# Patient Record
Sex: Female | Born: 1960 | Race: Black or African American | Hispanic: No | Marital: Married | Smoking: Never smoker
Health system: Southern US, Community
[De-identification: ages and names within clinical notes are randomized; demographics above are authoritative.]

## PROBLEM LIST (undated history)

## (undated) DIAGNOSIS — D649 Anemia, unspecified: Secondary | ICD-10-CM

## (undated) DIAGNOSIS — N83209 Unspecified ovarian cyst, unspecified side: Secondary | ICD-10-CM

## (undated) DIAGNOSIS — G4730 Sleep apnea, unspecified: Secondary | ICD-10-CM

## (undated) DIAGNOSIS — I10 Essential (primary) hypertension: Secondary | ICD-10-CM

## (undated) DIAGNOSIS — Z8619 Personal history of other infectious and parasitic diseases: Secondary | ICD-10-CM

## (undated) HISTORY — DX: Personal history of other infectious and parasitic diseases: Z86.19

## (undated) HISTORY — DX: Anemia, unspecified: D64.9

## (undated) HISTORY — DX: Unspecified ovarian cyst, unspecified side: N83.209

## (undated) HISTORY — DX: Sleep apnea, unspecified: G47.30

---

## 2004-03-26 HISTORY — PX: ABDOMINAL HYSTERECTOMY: SHX81

## 2005-01-18 ENCOUNTER — Inpatient Hospital Stay: Payer: Self-pay | Admitting: Obstetrics and Gynecology

## 2008-01-01 ENCOUNTER — Ambulatory Visit: Payer: Self-pay | Admitting: Family Medicine

## 2009-05-04 ENCOUNTER — Ambulatory Visit: Payer: Self-pay | Admitting: Family Medicine

## 2011-03-30 HISTORY — PX: OTHER SURGICAL HISTORY: SHX169

## 2012-03-29 ENCOUNTER — Emergency Department: Payer: Self-pay | Admitting: Internal Medicine

## 2012-03-29 LAB — CBC
HGB: 12.2 g/dL (ref 12.0–16.0)
MCH: 27.3 pg (ref 26.0–34.0)
MCHC: 33.1 g/dL (ref 32.0–36.0)
MCV: 83 fL (ref 80–100)
Platelet: 294 10*3/uL (ref 150–440)
RBC: 4.47 10*6/uL (ref 3.80–5.20)
RDW: 13.7 % (ref 11.5–14.5)

## 2012-03-29 LAB — COMPREHENSIVE METABOLIC PANEL
Albumin: 4.2 g/dL (ref 3.4–5.0)
Anion Gap: 9 (ref 7–16)
Calcium, Total: 9.1 mg/dL (ref 8.5–10.1)
Chloride: 109 mmol/L — ABNORMAL HIGH (ref 98–107)
Creatinine: 0.98 mg/dL (ref 0.60–1.30)
EGFR (African American): >60
EGFR (Non-African Amer.): >60
Glucose: 142 mg/dL — ABNORMAL HIGH (ref 65–99)
Osmolality: 288 (ref 275–301)
Potassium: 3.8 mmol/L (ref 3.5–5.1)

## 2012-03-29 LAB — PROTIME-INR
INR: 0.8
Prothrombin Time: 11.9 secs (ref 11.5–14.7)

## 2013-07-15 LAB — LIPID PANEL
Cholesterol: 251 mg/dL — AB (ref 0–200)
HDL: 44 mg/dL (ref 35–70)
LDL CALC: 181 mg/dL
TRIGLYCERIDES: 130 mg/dL (ref 40–160)

## 2013-07-15 LAB — CBC AND DIFFERENTIAL
HEMATOCRIT: 36 % (ref 36–46)
Hemoglobin: 11.8 g/dL — AB (ref 12.0–16.0)
PLATELETS: 287 10*3/uL (ref 150–399)
WBC: 4.9 10*3/mL

## 2013-07-15 LAB — BASIC METABOLIC PANEL
BUN: 9 mg/dL (ref 4–21)
CREATININE: 1 mg/dL (ref 0.5–1.1)
GLUCOSE: 108 mg/dL
Potassium: 4.4 mmol/L (ref 3.4–5.3)
Sodium: 142 mmol/L (ref 137–147)

## 2013-07-15 LAB — HEMOGLOBIN A1C: HEMOGLOBIN A1C: 6.1

## 2013-07-15 LAB — HEPATIC FUNCTION PANEL
ALT: 24 U/L (ref 7–35)
AST: 17 U/L (ref 13–35)

## 2013-07-15 LAB — TSH: TSH: 1.59 u[IU]/mL (ref 0.41–5.90)

## 2013-07-20 ENCOUNTER — Ambulatory Visit: Payer: Self-pay | Admitting: Family Medicine

## 2013-08-14 ENCOUNTER — Ambulatory Visit: Payer: Self-pay | Admitting: Family Medicine

## 2013-08-21 ENCOUNTER — Ambulatory Visit: Payer: Self-pay | Admitting: Family Medicine

## 2013-09-03 ENCOUNTER — Ambulatory Visit: Payer: Self-pay | Admitting: Obstetrics and Gynecology

## 2013-09-07 ENCOUNTER — Ambulatory Visit: Payer: Self-pay | Admitting: Obstetrics and Gynecology

## 2013-09-07 HISTORY — PX: TOTAL ABDOMINAL HYSTERECTOMY W/ BILATERAL SALPINGOOPHORECTOMY: SHX83

## 2013-09-08 LAB — PATHOLOGY REPORT

## 2013-09-29 ENCOUNTER — Ambulatory Visit: Payer: Self-pay | Admitting: Gynecologic Oncology

## 2013-10-24 ENCOUNTER — Ambulatory Visit: Payer: Self-pay | Admitting: Gynecologic Oncology

## 2013-11-09 ENCOUNTER — Ambulatory Visit: Payer: Self-pay | Admitting: Gastroenterology

## 2013-11-11 LAB — PATHOLOGY REPORT

## 2014-07-16 NOTE — Op Note (Signed)
PATIENT NAME:  Grace [name], Grace [name] MR#:  [id number] DATE OF BIRTH:  Aug 16, 1960  DATE OF PROCEDURE:  03/29/2012  PREOPERATIVE DIAGNOSIS:  Closed posterior dislocation, right elbow.  POSTOPERATIVE DIAGNOSIS:  Closed posterior dislocation, right elbow.  PROCEDURE PERFORMED:  Closed reduction, right elbow, ulna and radial head.   SURGEON: Park Breed, M.D.   ANESTHESIA:  Conscious sedation by Ferman Hamming, MD  COMPLICATIONS: None.   DRAINS: None.   ESTIMATED BLOOD LOSS: None. Replaced: None.   DESCRIPTION OF PROCEDURE: The patient was placed in the supine position on her Emergency Room bed and after discussion of the pros and cons of closed reduction, the patient agreed to proceed. The patient was given IV Versed and fentanyl until adequately sedated. [name] closed reduction of the elbow dislocation was then carried out, first reducing the ulna and then the radial head which remained lateral. Good range of motion was obtained following this. [name]  well padded sugar tong splint was applied. Post reduction x-rays were made and showed good reduction of the elbow. There were no significant fracture fragments noted at all.   RECOMMENDATION: The patient will ice and elevate the arm, use [name] sling. She will return to my office in [name] week to 10 days for evaluation.  Dr. Benjaman Lobe will provide analgesic medicine for her.   ____________________________ Park Breed, MD hem:ct D: 03/29/2012 20:50:09 ET T: 03/30/2012 11:46:42 ET JOB#: [id number]  cc: Park Breed, MD, <Dictator> Park Breed MD ELECTRONICALLY SIGNED 03/30/2012 13:09

## 2014-07-16 NOTE — Consult Note (Signed)
Brief Consult Note: Diagnosis: Posterior dislocation right elbow ulna and radial head.   Patient was seen by consultant.   Recommend to proceed with surgery or procedure.   Recommend further assessment or treatment.   Discussed with Attending MD.   Comments: 54 year old female slipped on her floor after polishing it today injuring the right wrist and forearm.  Brought to Emergency Room where exam and X-rays show a posterior dislocation of the right elbow without sign of fracture.  X-rays of right wrist normal.  Exam:  Alert and cooperative. Right elbow clinically dislocated.  circulation/sensation/motor function good and skin intact.  Pain on any range of motion.    X-rays: as above  Rx;  Closed reduction and splinting recommended under conscious sedation.  Risks and benefits of surgery were discussed at length including but not limited to infection, non union, nerve or blood vessed damage, non union, need for repeat surgery, blood clots and lung emboli, and death. Patient consents and Dr Benjaman Lobe will oversee the sedation.  Electronic Signatures: Park Breed (MD)  (Signed 04-Jan-14 20:46)  Authored: Brief Consult Note   Last Updated: 04-Jan-14 20:46 by Park Breed (MD)

## 2014-07-17 NOTE — Op Note (Signed)
PATIENT NAME:  Grace [name], Grace [name]#:  [id number] DATE OF BIRTH:  [birth year]  DATE OF PROCEDURE:  09/07/2013  PREOPERATIVE DIAGNOSIS: Complex right ovarian cyst.   POSTOPERATIVE DIAGNOSES:  1.  Extensive pelvic adhesions.  2.  Complex right ovarian cyst.   PROCEDURE:  1.  Extensive adhesiolysis incorporating greater than 80% of total operating time.  2.  Right salpingo-oophorectomy.  3.  Cystoscopy.   SURGEON: Laverta Baltimore, MD  ANESTHESIA: General endotracheal anesthesia.   INDICATIONS: This is a 54 year old, gravida 2, para 2 patient noted to have a complex right ovarian mass and was having pelvic pain. The patient's CA-125 level was within normal limits. She has elected to have laparoscopic bilateral salpingo-oophorectomy. She is status post a prior hysterectomy.   PROCEDURE: After adequate general endotracheal anesthesia, the patient was placed in the dorsal supine position with legs in the Noble stirrups. Abdomen, perineum, and vagina were prepped in normal sterile fashion. Straight catheterization of the bladder yielded 50 mL clear urine. A sponge stick was placed in the vagina to be used for manipulation during the procedure. Gloves were changed. A 15 mm infraumbilical incision was made after injecting with 0.5% Marcaine. Laparoscope was advanced into the abdominal cavity with the Optiview cannula. Once laparoscope was placed intra-abdominally, the patient's abdomen was insufflated with carbon dioxide. A second port site was placed in the left lower quadrant 3 cm medial to the left anterior iliac spine. An 11 mm port was advanced under direct visualization into the abdominal cavity. A third port site was placed in the right lower quadrant again 3 cm medial to the right anterior iliac spine. A 5 mm port was advanced into the abdominal cavity under direct visualization. Initial impression was a large, central pelvic mass noted with circumferential adhesions to the vaginal cuff, to both  side walls and small bowel draped posterior to this mass.   Harmonic scalpel was brought up. Extensive adhesiolysis then took place again entailing greater than 80% of total operating time. Traction and countertraction used with sharp dissection with the Harmonic scalpel. Meticulous dissection was performed to remove the small bowel from the posterior portion of the mass. Ultimately, further dissection could not be performed given the size of the mass and difficulty with direct visualization; therefore, the mass was opened and drained. Clear mucinous fluid resulted. Once the ovarian mass was drained, the infundibulopelvic ligament on the right was cauterized and transected with the Harmonic scalpel and ultimately the cyst was freed from all aspects of adhesions. Endobag was brought into the abdominal cavity and to the left lower port site the ovarian mass was removed. Surgeon did not identify a left ovary or fallopian tube after the central mass was removed. There is a possibility that the 2 adnexa were conjoined in the middle and were removed at the time of surgery. There was residual small bowel adhesions to the posterior cul-de-sac and these were taken down sharply as well and the descending colon was adhesed to the left sidewall and these adhesions were taken down as well. The right ureter was identified with normal peristaltic activity; however, the left ureter was not identified. There was some small oozing raw areas in the posterior cul-de-sac where the adhesiolysis took place. Arista was then applied to these areas under direct visualization.   The patient's abdomen was deflated to 7 mmHg after copious irrigation. Good hemostasis was noted. Given the left ureter was not identified, a cystoscopy was performed after administering methylene blue. The ureteral orifices were  identified without difficulty and there was a normal pluming effect with methylene blue coming into the bladder. The patient's bladder  was drained and the patient's abdomen was then deflated and all port sites were removed. The lower port site was closed with a fascial layer, 2 separate 2-0 Vicryl sutures as well as the infraumbilical incision was closed with a 2-0 Vicryl suture and all skin was reapproximated with interrupted 4-0 Vicryl suture. Dermabond was placed on all the incisional sites. Sponge stick was removed. Intraoperative pictures were taken. There were no complications. Estimated blood loss 25 mL. Intraoperative fluids 1300 mL. The patient tolerated the procedure well and was taken to the recovery room in good condition.    ____________________________ Boykin Nearing, MD tjs:aw D: 09/07/2013 12:10:52 ET T: 09/07/2013 12:22:16 ET JOB#: [id number]  cc: Boykin Nearing, MD, <Dictator> Boykin Nearing MD ELECTRONICALLY SIGNED 09/12/2013 11:55

## 2014-10-19 ENCOUNTER — Other Ambulatory Visit: Payer: Self-pay | Admitting: Family Medicine

## 2014-10-19 DIAGNOSIS — Z1231 Encounter for screening mammogram for malignant neoplasm of breast: Secondary | ICD-10-CM

## 2014-10-22 ENCOUNTER — Ambulatory Visit
Admission: RE | Admit: 2014-10-22 | Discharge: 2014-10-22 | Disposition: A | Discharge status: Home / Self Care | Payer: No Typology Code available for payment source | Source: Ambulatory Visit | Attending: Obstetrics and Gynecology | Admitting: Obstetrics and Gynecology

## 2014-10-22 DIAGNOSIS — Z1231 Encounter for screening mammogram for malignant neoplasm of breast: Secondary | ICD-10-CM | POA: Diagnosis not present

## 2015-09-02 ENCOUNTER — Other Ambulatory Visit: Payer: Self-pay | Admitting: Family Medicine

## 2015-09-02 DIAGNOSIS — Z1231 Encounter for screening mammogram for malignant neoplasm of breast: Secondary | ICD-10-CM

## 2015-09-15 DIAGNOSIS — M1380 Other specified arthritis, unspecified site: Secondary | ICD-10-CM | POA: Insufficient documentation

## 2015-09-15 DIAGNOSIS — Z8619 Personal history of other infectious and parasitic diseases: Secondary | ICD-10-CM | POA: Insufficient documentation

## 2015-09-15 DIAGNOSIS — N83209 Unspecified ovarian cyst, unspecified side: Secondary | ICD-10-CM

## 2015-09-15 DIAGNOSIS — N951 Menopausal and female climacteric states: Secondary | ICD-10-CM | POA: Insufficient documentation

## 2015-09-15 DIAGNOSIS — N838 Other noninflammatory disorders of ovary, fallopian tube and broad ligament: Secondary | ICD-10-CM | POA: Insufficient documentation

## 2015-09-15 DIAGNOSIS — B3731 Acute candidiasis of vulva and vagina: Secondary | ICD-10-CM | POA: Insufficient documentation

## 2015-09-15 DIAGNOSIS — R102 Pelvic and perineal pain: Secondary | ICD-10-CM | POA: Insufficient documentation

## 2015-09-15 DIAGNOSIS — D649 Anemia, unspecified: Secondary | ICD-10-CM | POA: Insufficient documentation

## 2015-09-15 DIAGNOSIS — B373 Candidiasis of vulva and vagina: Secondary | ICD-10-CM | POA: Insufficient documentation

## 2015-09-15 DIAGNOSIS — A568 Sexually transmitted chlamydial infection of other sites: Secondary | ICD-10-CM | POA: Insufficient documentation

## 2015-09-15 HISTORY — DX: Unspecified ovarian cyst, unspecified side: N83.209

## 2015-09-19 ENCOUNTER — Ambulatory Visit: Payer: Self-pay | Admitting: Physician Assistant

## 2015-09-22 ENCOUNTER — Other Ambulatory Visit: Payer: Self-pay

## 2015-09-22 ENCOUNTER — Encounter: Payer: Self-pay | Admitting: Physician Assistant

## 2015-09-22 ENCOUNTER — Ambulatory Visit (INDEPENDENT_AMBULATORY_CARE_PROVIDER_SITE_OTHER): Payer: BLUE CROSS/BLUE SHIELD | Admitting: Physician Assistant

## 2015-09-22 VITALS — BP 140/80 | HR 87 | Temp 98.7°F | Resp 16 | Wt 213.0 lb

## 2015-09-22 DIAGNOSIS — M7582 Other shoulder lesions, left shoulder: Secondary | ICD-10-CM | POA: Diagnosis not present

## 2015-09-22 DIAGNOSIS — R05 Cough: Secondary | ICD-10-CM | POA: Diagnosis not present

## 2015-09-22 DIAGNOSIS — J0140 Acute pansinusitis, unspecified: Secondary | ICD-10-CM | POA: Diagnosis not present

## 2015-09-22 DIAGNOSIS — R059 Cough, unspecified: Secondary | ICD-10-CM

## 2015-09-22 MED ORDER — HYDROCODONE-HOMATROPINE 5-1.5 MG/5ML PO SYRP: 5.0000 mL | ORAL_SOLUTION | Freq: Three times a day (TID) | ORAL | Status: DC | PRN

## 2015-09-22 MED ORDER — MELOXICAM 15 MG PO TABS: 15.0000 mg | ORAL_TABLET | Freq: Every day | ORAL | Status: DC

## 2015-09-22 MED ORDER — AMOXICILLIN-POT CLAVULANATE 875-125 MG PO TABS: 1.0000 | ORAL_TABLET | Freq: Two times a day (BID) | ORAL | Status: DC

## 2015-09-22 NOTE — Progress Notes (Signed)
Patient: Grace [name] Female    DOB: 04-15-1960   55 y.o.   MRN: [id number] Visit Date: 09/22/2015  Today's Provider: Mar Daring, PA-C   Chief Complaint  Patient presents with  . Sinusitis   Subjective:    Sinusitis This is a new problem. The current episode started 1 to 4 weeks ago. The problem has been gradually worsening since onset. There has been no fever. Associated symptoms include chills, congestion, coughing, ear pain, headaches, a hoarse voice, shortness of breath, sinus pressure and a sore throat. (Hurting all over) Treatments tried: Allegra,green tea. The treatment provided no relief.   Breast tenderness in left breast. No family history. No nipple discharge. Swollen and tender. No discoloration. Feels bumpy. Sore to move. States she did move her oven last night out from the wall to mop behind.     No Known Allergies Current Meds  Medication Sig  . Polyethylene Glycol 3350 (MIRALAX PO) Take by mouth.    Review of Systems  Constitutional: Positive for chills and fatigue. Negative for fever.  HENT: Positive for congestion, ear pain, hoarse voice, postnasal drip, rhinorrhea, sinus pressure and sore throat.   Respiratory: Positive for cough, chest tightness, shortness of breath and wheezing.   Cardiovascular: Positive for chest pain. Negative for palpitations and leg swelling.  Gastrointestinal: Negative.   Neurological: Positive for dizziness, light-headedness and headaches.    Social History  Substance Use Topics  . Smoking status: Never Smoker   . Smokeless tobacco: Not on file  . Alcohol Use: Yes     Comment: Occasional; once a month   Objective:   BP 140/80 mmHg  Pulse 87  Temp(Src) 98.7 F (37.1 C) (Oral)  Resp 16  Wt 213 lb (96.616 kg)  Physical Exam  Constitutional: She appears well-developed and well-nourished. No distress.  HENT:  Head: Normocephalic and atraumatic.  Right Ear: Hearing, tympanic membrane, external ear and ear  canal normal.  Left Ear: Hearing, tympanic membrane, external ear and ear canal normal.  Nose: Mucosal edema present. No rhinorrhea. Right sinus exhibits maxillary sinus tenderness and frontal sinus tenderness. Left sinus exhibits maxillary sinus tenderness and frontal sinus tenderness.  Mouth/Throat: Uvula is midline, oropharynx is clear and moist and mucous membranes are normal. No oropharyngeal exudate, posterior oropharyngeal edema or posterior oropharyngeal erythema.  Neck: Normal range of motion. Neck supple. No tracheal deviation present. No thyromegaly present.  Cardiovascular: Normal rate, regular rhythm and normal heart sounds.  Exam reveals no gallop and no friction rub.   No murmur heard. Pulmonary/Chest: Effort normal and breath sounds normal. No stridor. No respiratory distress. She has no wheezes. She has no rales. She exhibits tenderness (tender along pectoralis major muscle and most tender at insertion).  Lymphadenopathy:    She has no cervical adenopathy.  Skin: She is not diaphoretic.  Vitals reviewed.     Assessment & Plan:     1. Acute pansinusitis, recurrence not specified Worsening symptoms x 3 weeks. Will treat with augmentin as below. Continue allergy medication as directed. Stay well hydrated and get plenty of rest. Hycodan cough syrup given for nighttime cough. Drowsiness precautions advised. She is to call if symptoms fail to improve or worsen. - amoxicillin-clavulanate (AUGMENTIN) 875-125 MG tablet; Take 1 tablet by mouth 2 (two) times daily.  Dispense: 20 tablet; Refill: 0  2. Cough See above medical treatment plan. - HYDROcodone-homatropine (HYCODAN) 5-1.5 MG/5ML syrup; Take 5 mLs by mouth every 8 (eight) hours as  needed.  Dispense: 180 mL; Refill: 0  3. Pectoralis major tendonitis, left New onset. Breast exam normal. Tenderness along pectoralis major muscle. Will give meloxicam as below for inflammation. She is scheduled for mammogram in August. She is to call  if no improvement. - meloxicam (MOBIC) 15 MG tablet; Take 1 tablet (15 mg total) by mouth daily.  Dispense: 30 tablet; Refill: 0       Mar Daring, PA-C  [facility]

## 2015-09-22 NOTE — Patient Instructions (Signed)

## 2015-10-19 ENCOUNTER — Other Ambulatory Visit: Payer: Self-pay | Admitting: Physician Assistant

## 2015-10-19 DIAGNOSIS — M7582 Other shoulder lesions, left shoulder: Secondary | ICD-10-CM

## 2015-10-28 ENCOUNTER — Other Ambulatory Visit: Payer: Self-pay | Admitting: Family Medicine

## 2015-10-28 ENCOUNTER — Telehealth: Payer: Self-pay | Admitting: Physician Assistant

## 2015-10-28 ENCOUNTER — Ambulatory Visit
Admission: RE | Admit: 2015-10-28 | Discharge: 2015-10-28 | Disposition: A | Discharge status: Home / Self Care | Payer: BLUE CROSS/BLUE SHIELD | Source: Ambulatory Visit | Attending: Family Medicine | Admitting: Family Medicine

## 2015-10-28 DIAGNOSIS — M7582 Other shoulder lesions, left shoulder: Secondary | ICD-10-CM

## 2015-10-28 DIAGNOSIS — Z1231 Encounter for screening mammogram for malignant neoplasm of breast: Secondary | ICD-10-CM

## 2015-10-28 MED ORDER — MELOXICAM 15 MG PO TABS: 15.0000 mg | ORAL_TABLET | Freq: Every day | ORAL | 0 refills | Status: DC

## 2015-10-28 NOTE — Telephone Encounter (Signed)
Sent refill

## 2015-10-28 NOTE — Telephone Encounter (Signed)
Pt called saying she needs Mobic.   She said she got the RX filled but lost the medication.  She is having pain in her left shoulder.  She uses [facility].  Her call back is [phone].  Thanks [employer]

## 2015-10-28 NOTE — Telephone Encounter (Signed)
Please advise.  Thanks,  -Joseline 

## 2015-11-17 ENCOUNTER — Ambulatory Visit (INDEPENDENT_AMBULATORY_CARE_PROVIDER_SITE_OTHER): Payer: BLUE CROSS/BLUE SHIELD | Admitting: Physician Assistant

## 2015-11-17 VITALS — BP 142/80 | HR 78 | Temp 98.3°F | Resp 16 | Wt 216.8 lb

## 2015-11-17 DIAGNOSIS — W19XXXA Unspecified fall, initial encounter: Secondary | ICD-10-CM

## 2015-11-17 DIAGNOSIS — G8929 Other chronic pain: Secondary | ICD-10-CM

## 2015-11-17 DIAGNOSIS — N3000 Acute cystitis without hematuria: Secondary | ICD-10-CM | POA: Diagnosis not present

## 2015-11-17 DIAGNOSIS — R1031 Right lower quadrant pain: Secondary | ICD-10-CM

## 2015-11-17 LAB — POCT URINALYSIS DIPSTICK
BILIRUBIN UA: NEGATIVE
Glucose, UA: NEGATIVE
KETONES UA: NEGATIVE
Nitrite, UA: NEGATIVE
PH UA: 6.5
RBC UA: NEGATIVE
SPEC GRAV UA: 1.02
Urobilinogen, UA: 1

## 2015-11-17 MED ORDER — SULFAMETHOXAZOLE-TRIMETHOPRIM 800-160 MG PO TABS: 1.0000 | ORAL_TABLET | Freq: Two times a day (BID) | ORAL | 0 refills | Status: DC

## 2015-11-17 NOTE — Progress Notes (Signed)
Patient: Grace [name] Female    DOB: Aug 01, 1960   55 y.o.   MRN: [id number] Visit Date: 11/17/2015  Today's Provider: Mar Daring, PA-C   Chief Complaint  Patient presents with  . Abdominal Pain  . Back Pain   Subjective:    HPI   Patient comes in office today with concerns of lower right quadrant pain intermittent over a period of 4-5 months. Patient describes pain as sharp. Patient also reports pain in her lower back on the right side for the past year and states that she has noticed that towards her hips seems to be swollen. Patient denies urinary symptoms of G.I upset. She states that she takes a muscle relaxer in the PM to help her sleep, she reports that she only has relief from pain in abdomen and back when laying flat on her back.   No Known Allergies Current Meds  Medication Sig  . hydrocortisone 2.5 % cream APPLY 1 APPLICATION TOPICALLY TWO (2) TIMES A DAY.  Marland Kitchen ketoconazole (NIZORAL) 2 % cream APPLY 1 APPLICATION TOPICALLY DAILY.  . meloxicam (MOBIC) 15 MG tablet Take 1 tablet (15 mg total) by mouth daily.  . naproxen (NAPROSYN) 500 MG tablet Take by mouth. Reported on 09/22/2015  . Polyethylene Glycol 3350 (MIRALAX PO) Take by mouth.    Review of Systems  Constitutional: Negative for activity change, appetite change, chills, diaphoresis, fatigue, fever and unexpected weight change.  HENT: Positive for ear pain (left side), sinus pressure and sneezing. Negative for congestion, dental problem, drooling, ear discharge, facial swelling, hearing loss, mouth sores, nosebleeds, postnasal drip, rhinorrhea, sore throat, tinnitus, trouble swallowing and voice change.   Eyes: Negative.   Respiratory: Negative.   Cardiovascular: Positive for leg swelling (patient reports left leg, she states that it is due to fall). Negative for chest pain and palpitations.  Gastrointestinal: Positive for abdominal pain. Negative for abdominal distention, anal bleeding, blood in stool,  constipation, diarrhea, nausea, rectal pain and vomiting.  Endocrine: Negative.   Genitourinary: Negative for decreased urine volume, difficulty urinating, dyspareunia, dysuria, enuresis, flank pain, frequency, genital sores, hematuria, menstrual problem, pelvic pain, urgency, vaginal bleeding, vaginal discharge and vaginal pain.  Musculoskeletal: Positive for back pain and joint swelling. Negative for arthralgias, gait problem and myalgias.  Skin: Negative.   Allergic/Immunologic: Negative.   Neurological: Negative.   Hematological: Negative.   Psychiatric/Behavioral: Negative.     Social History  Substance Use Topics  . Smoking status: Never Smoker  . Smokeless tobacco: Not on file  . Alcohol use Yes     Comment: Occasional; once a month   Objective:   BP (!) 142/80   Pulse 78   Temp 98.3 F (36.8 C) (Oral)   Resp 16   Wt 216 lb 12.8 oz (98.3 kg)   BMI 33.96 kg/m   Physical Exam  Constitutional: She appears well-developed and well-nourished. No distress.  Neck: Normal range of motion. Neck supple. No tracheal deviation present. No thyromegaly present.  Cardiovascular: Normal rate, regular rhythm and normal heart sounds.  Exam reveals no gallop and no friction rub.   No murmur heard. Pulmonary/Chest: Effort normal and breath sounds normal. No respiratory distress. She has no wheezes. She has no rales.  Abdominal: Soft. Normal appearance and bowel sounds are normal. She exhibits no distension, no abdominal bruit, no ascites, no pulsatile midline mass and no mass. There is no hepatosplenomegaly. There is tenderness in the right lower quadrant, periumbilical area and  suprapubic area. There is no CVA tenderness, no tenderness at McBurney's point and negative Murphy's sign.  Musculoskeletal:       Right hip: She exhibits tenderness. She exhibits normal range of motion, normal strength and no bony tenderness.       Left hip: She exhibits normal range of motion, normal strength and no  tenderness.       Lumbar back: She exhibits normal range of motion, no tenderness, no bony tenderness and no spasm.  Lymphadenopathy:    She has no cervical adenopathy.  Skin: She is not diaphoretic.  Vitals reviewed.     Assessment & Plan:     1. Chronic RLQ pain UA was positive for leukocytes. Was tender to palpation without guarding in RLQ. Does have complicated history of ovarian cyst with complex pathology in right ovary that was questionable for possible early malignancy. Will get CT scan of abdomen/pelvis to R/O tumor vs appendicitis as cause for pain. Also question radicular pain from back to pelvic region as possible cause as well.  - POCT Urinalysis Dipstick - CT Abdomen Pelvis W Contrast; Future  2. Fall, initial encounter Patient fell approx 3-4 months ago, to her recollection, and continues to have pain in her left lower extremity with an area of swelling that is tender to palpation. Will get xray to R/O stress fracture or fracture from fall. - DG Tibia/Fibula Left; Future  3. Acute cystitis without hematuria UA was positive for leukocytes. Will treat with bactrim as below and send for urine culture. Will f/u pending results.  - sulfamethoxazole-trimethoprim (BACTRIM DS,SEPTRA DS) 800-160 MG tablet; Take 1 tablet by mouth 2 (two) times daily.  Dispense: 14 tablet; Refill: 0 - Urine Culture       Mar Daring, PA-C  [facility]

## 2015-11-17 NOTE — Patient Instructions (Signed)

## 2015-11-18 ENCOUNTER — Telehealth: Payer: Self-pay

## 2015-11-18 ENCOUNTER — Ambulatory Visit
Admission: RE | Admit: 2015-11-18 | Discharge: 2015-11-18 | Disposition: A | Discharge status: Home / Self Care | Payer: BLUE CROSS/BLUE SHIELD | Source: Ambulatory Visit | Attending: Physician Assistant | Admitting: Physician Assistant

## 2015-11-18 DIAGNOSIS — W19XXXA Unspecified fall, initial encounter: Secondary | ICD-10-CM

## 2015-11-18 DIAGNOSIS — M79605 Pain in left leg: Secondary | ICD-10-CM | POA: Insufficient documentation

## 2015-11-18 NOTE — Telephone Encounter (Signed)
-----   Message from Mar Daring, [place] sent at 11/18/2015  2:58 PM EDT ----- No fracture noted of lower extremity. No abnormality.

## 2015-11-18 NOTE — Telephone Encounter (Signed)
She can try heat to the area to see if it will help the body reabsorb the inflammatory chemicals there. She can also try compression stockings.

## 2015-11-18 NOTE — Telephone Encounter (Signed)
Patient advised.

## 2015-11-18 NOTE — Telephone Encounter (Signed)
Patient advised. Patient states swelling is still present, and wanted to know what she can do to help with that. Please advise.

## 2015-11-19 LAB — URINE CULTURE

## 2015-11-25 ENCOUNTER — Ambulatory Visit
Admission: RE | Admit: 2015-11-25 | Discharge: 2015-11-25 | Disposition: A | Discharge status: Home / Self Care | Payer: BLUE CROSS/BLUE SHIELD | Source: Ambulatory Visit | Attending: Physician Assistant | Admitting: Physician Assistant

## 2015-11-25 ENCOUNTER — Telehealth: Payer: Self-pay

## 2015-11-25 DIAGNOSIS — G8929 Other chronic pain: Secondary | ICD-10-CM | POA: Insufficient documentation

## 2015-11-25 DIAGNOSIS — R1031 Right lower quadrant pain: Secondary | ICD-10-CM | POA: Diagnosis present

## 2015-11-25 MED ORDER — IOPAMIDOL (ISOVUE-370) INJECTION 76%
100.0000 mL | Freq: Once | INTRAVENOUS | Status: AC | PRN
  Administered 2015-11-25: 100 mL via INTRAVENOUS

## 2015-11-25 NOTE — Telephone Encounter (Signed)
-----   Message from Mar Daring, [place] sent at 11/25/2015 12:24 PM EDT ----- No urinary tract infection.

## 2015-11-25 NOTE — Telephone Encounter (Signed)
Patient advised as directed below.  Thanks,  -Joseline 

## 2015-11-26 IMAGING — US US PELV - US TRANSVAGINAL
1 series · 13 of 25 positions shown · non-contrast
Comparison: None

CLINICAL DATA: Pelvic pain.  Status post hysterectomy.

EXAM:
TRANSABDOMINAL AND TRANSVAGINAL ULTRASOUND OF PELVIS
TECHNIQUE: Both transabdominal and transvaginal ultrasound examinations of the
pelvis were performed. Transabdominal technique was performed for
global imaging of the pelvis including uterus, ovaries, adnexal
regions, and pelvic cul-de-sac. It was necessary to proceed with
endovaginal exam following the transabdominal exam to visualize the
adnexal structures to better advantage.

[Series 1: us pelv - us transvaginal · 0.24mm/px · 13 of 59 slices shown]
[im 1/59]
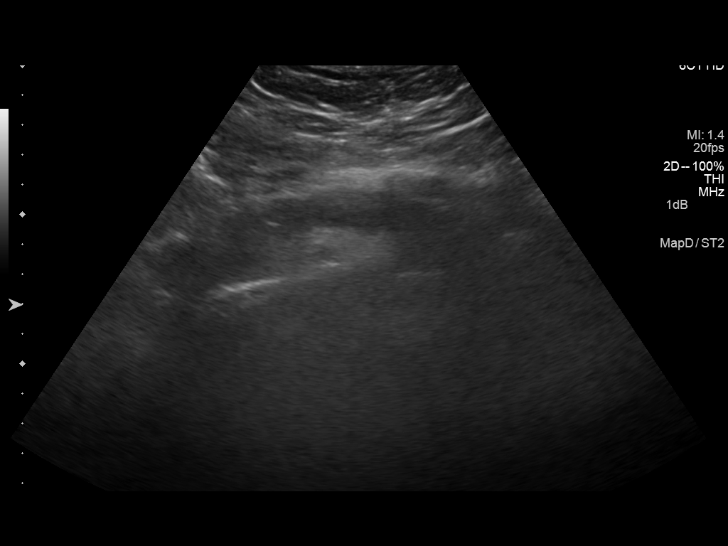
[im 5/59]
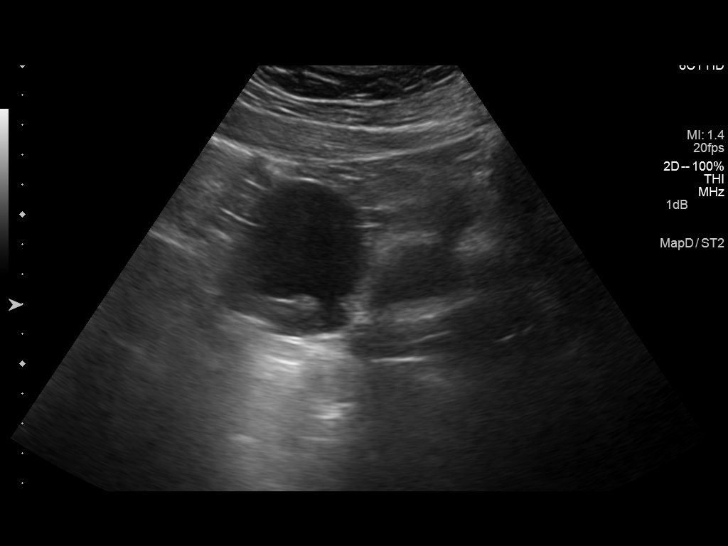
[im 10/59]
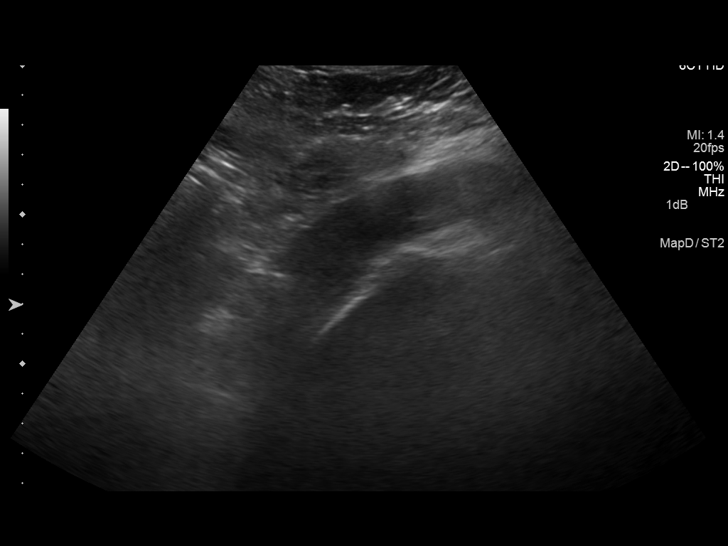
[im 15/59]
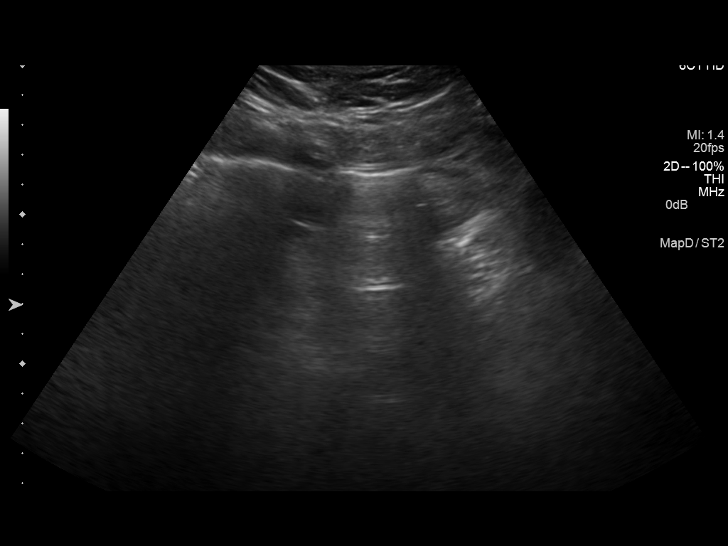
[im 20/59]
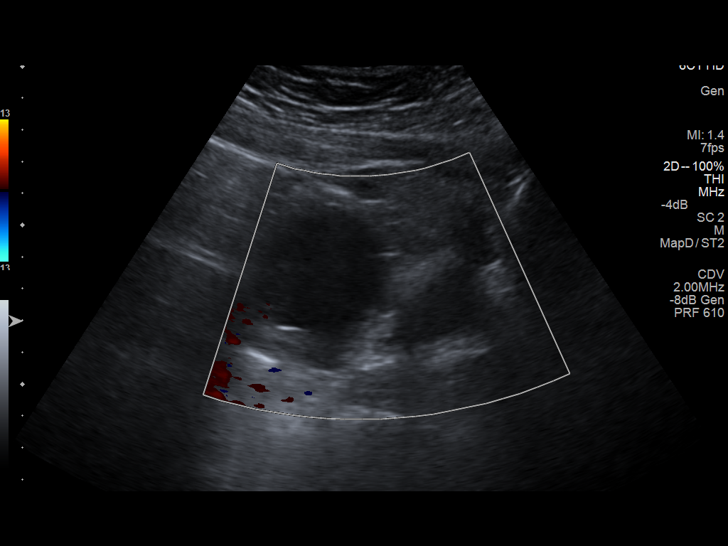
[im 25/59]
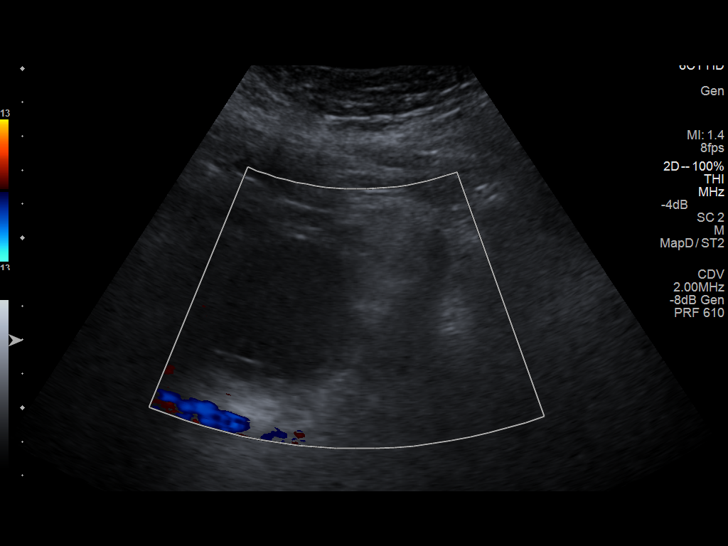
[im 30/59]
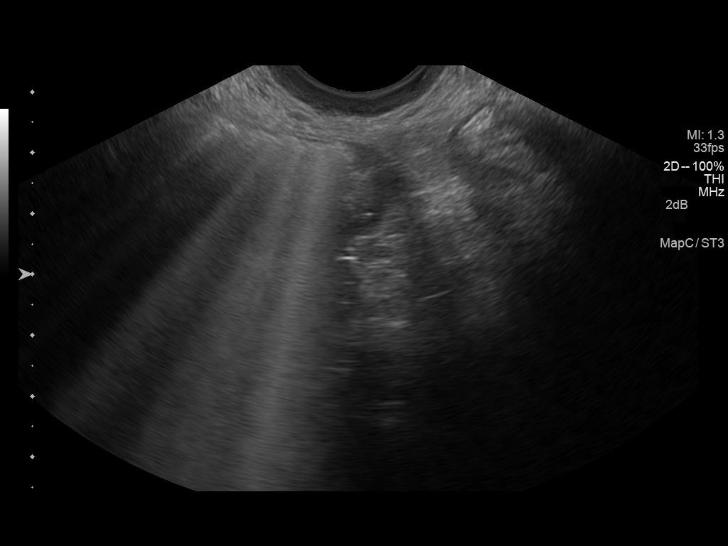
[im 34/59]
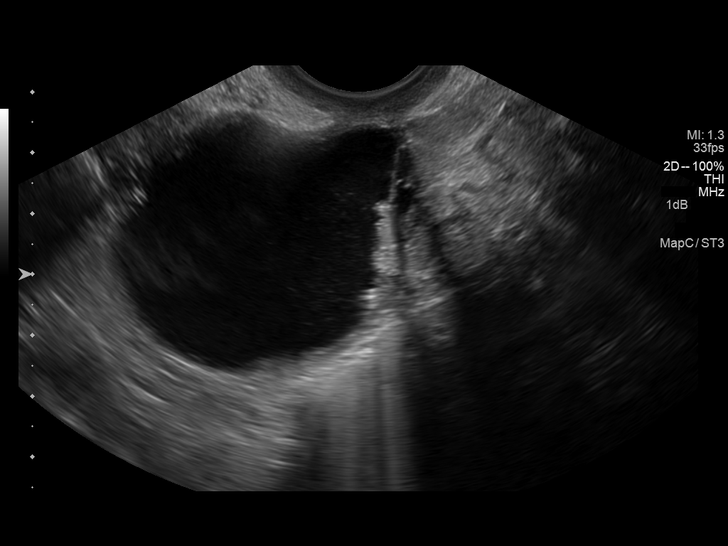
[im 39/59]
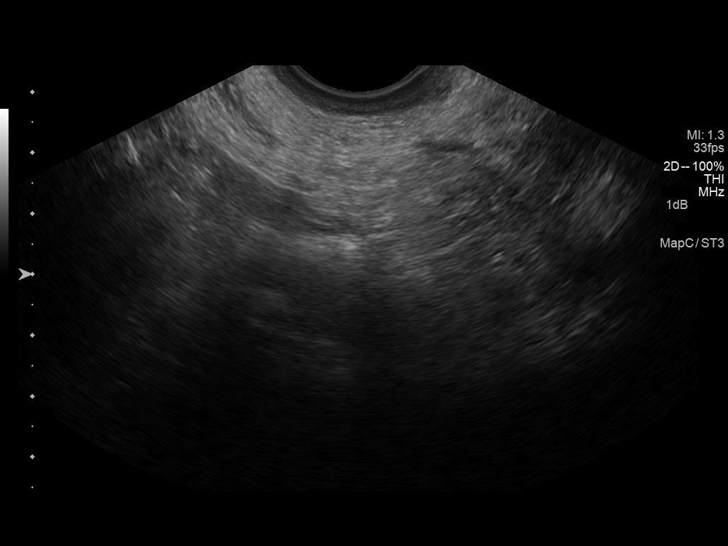
[im 44/59]
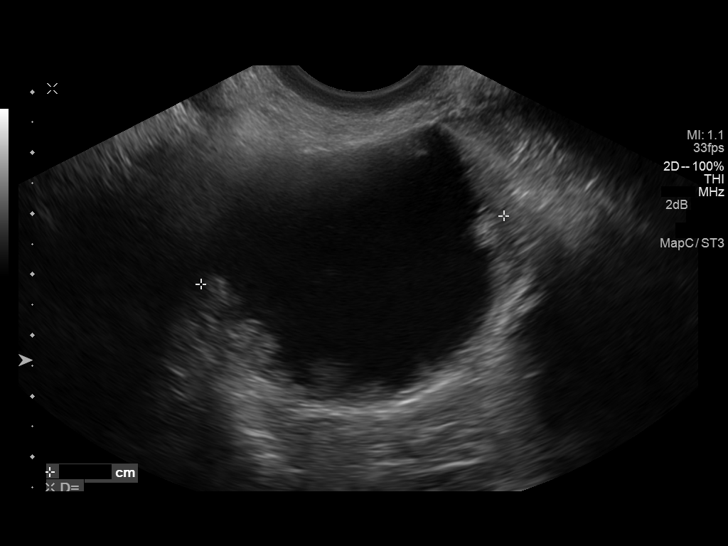
[im 49/59]
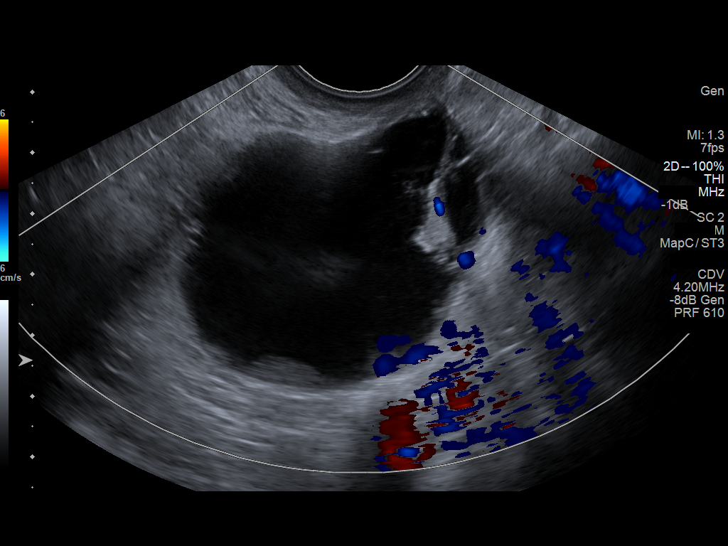
[im 54/59]
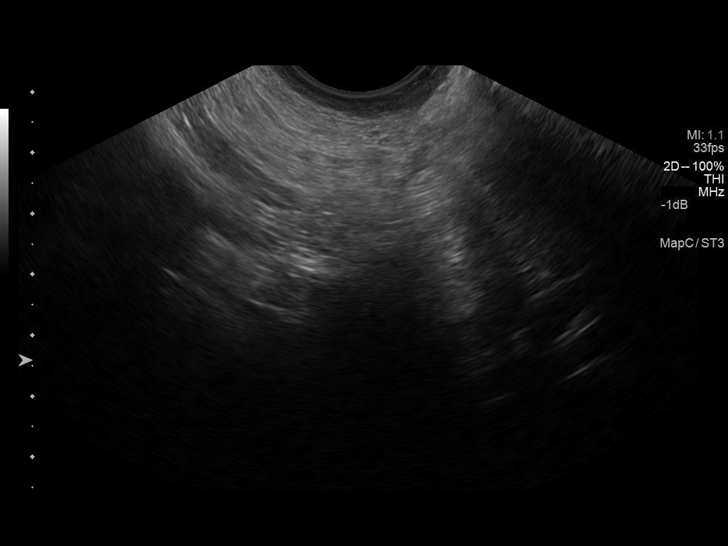
[im 59/59]
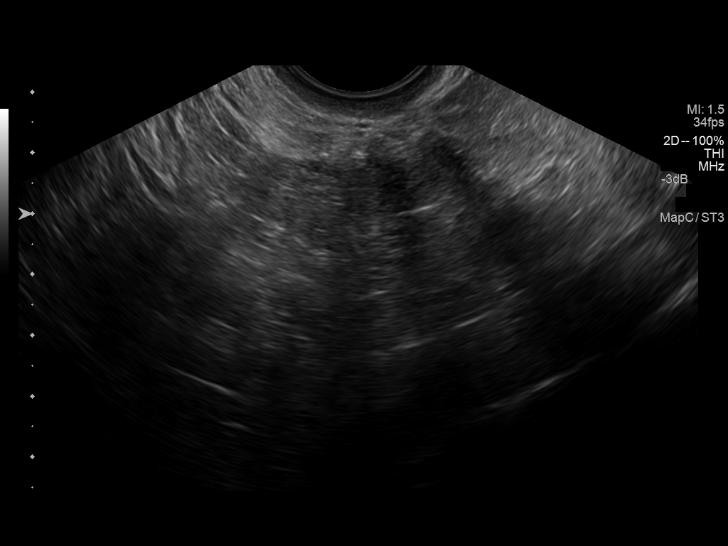

[13 of 25 positions shown; findings below may reference images not displayed]

FINDINGS: Uterus

Surgically absent

Right ovary

No normal right ovary. There is a complex cystic mass in the right
adnexa measuring 5.8 cm x 4.5 cm x 5.1 cm. This contains a mildly
thickened septation and multiple areas of nodular type soft tissue
along its margin, mostly posteriorly. No normal ovarian tissue is
seen.

Left ovary

Left ovary not visualized.  No left adnexal mass.

Other findings

No free fluid.
IMPRESSION: 1. Complex right adnexal cystic mass. This may reflect a cystic
ovarian neoplasm. No normal right ovarian tissue is visualized.
2. Left ovary not visualized. No left adnexal mass. Status post
hysterectomy. No pelvic free fluid

## 2015-11-29 ENCOUNTER — Telehealth: Payer: Self-pay

## 2015-11-29 NOTE — Telephone Encounter (Signed)
-----   Message from Mar Daring, [place] sent at 11/26/2015  9:22 PM EDT ----- No cause for right sided pain on CT

## 2015-11-29 NOTE — Telephone Encounter (Signed)
Pt returned your call.   ° °Thanks teri  °

## 2015-11-29 NOTE — Telephone Encounter (Signed)
LMTCB  Thanks,  -Joseline 

## 2015-11-29 NOTE — Telephone Encounter (Signed)
Pt returned your call 3:47  Thank sTeri

## 2015-11-30 NOTE — Telephone Encounter (Signed)
LMTCB  I couldn't call patient back yesterday. I was not in the office in the afternoon.

## 2015-11-30 NOTE — Telephone Encounter (Signed)
Patient advised as directed below.  Thanks,  -Irving Bloor 

## 2016-02-06 ENCOUNTER — Ambulatory Visit (INDEPENDENT_AMBULATORY_CARE_PROVIDER_SITE_OTHER): Payer: BLUE CROSS/BLUE SHIELD | Admitting: Physician Assistant

## 2016-02-06 ENCOUNTER — Encounter: Payer: Self-pay | Admitting: Physician Assistant

## 2016-02-06 VITALS — BP 130/62 | HR 89 | Temp 98.9°F | Resp 16 | Wt 221.4 lb

## 2016-02-06 DIAGNOSIS — R05 Cough: Secondary | ICD-10-CM

## 2016-02-06 DIAGNOSIS — H1132 Conjunctival hemorrhage, left eye: Secondary | ICD-10-CM | POA: Diagnosis not present

## 2016-02-06 DIAGNOSIS — R059 Cough, unspecified: Secondary | ICD-10-CM

## 2016-02-06 MED ORDER — BENZONATATE 100 MG PO CAPS: 100.0000 mg | ORAL_CAPSULE | Freq: Three times a day (TID) | ORAL | 0 refills | Status: DC | PRN

## 2016-02-06 MED ORDER — BENZONATATE 100 MG PO CAPS: 100.0000 mg | ORAL_CAPSULE | Freq: Three times a day (TID) | ORAL | 0 refills | Status: AC | PRN

## 2016-02-06 NOTE — Patient Instructions (Signed)
Viral Conjunctivitis Viral conjunctivitis is an inflammation of the clear membrane that covers the white part of your eye and the inner surface of your eyelid (conjunctiva). The inflammation is caused by a viral infection. The blood vessels in the conjunctiva become inflamed, causing the eye to become red or pink, and often itchy. Viral conjunctivitis can easily be passed from one person to another (contagious). CAUSES  Viral conjunctivitis is caused by a virus. A virus is a type of contagious germ. It can be spread by touching objects that have been contaminated with the virus, such as doorknobs or towels.  SYMPTOMS  Symptoms of viral conjunctivitis may include:   Eye redness.  Tearing or watery eyes.  Itchy eyes.  Burning feeling in the eyes.  Clear drainage from the eye.  Swollen eyelids.  A gritty feeling in the eye.  Light sensitivity. DIAGNOSIS  Viral conjunctivitis may be diagnosed with a medical history and physical exam. If you have discharge from your eye, the discharge may be tested to rule out other causes of conjunctivitis.  TREATMENT  Viral conjunctivitis does not respond to medicines that kill bacteria (antibiotics). Treatment for viral conjunctivitis is directed at stopping a bacterial infection from developing in addition to the viral infection. Treatment also aims to relieve your symptoms, such as itching. This may be done with antihistamine drops or other eye medicines. HOME CARE INSTRUCTIONS  Take medicines only as directed by your health care provider.  Avoid touching or rubbing your eyes.  Apply a warm, clean washcloth to your eye for 10-20 minutes, 3-4 times per day.  If you wear contact lenses, do not wear them until the inflammation is gone and your health care provider says it is safe to wear them again. Ask your health care provider how to sterilize or replace your contact lenses before using them again. Wear glasses until you can resume wearing  contacts.  Avoid wearing eye makeup until the inflammation is gone. Throw away any old eye cosmetics that may be contaminated.  Change or wash your pillowcase every day.  Do not share towels or washcloths. This may spread the infection.  Wash your hands often with soap and water. Use paper towels to dry your hands.  Gently wipe away any drainage from your eye with a warm, wet washcloth or a cotton ball.  Be very careful to avoid touching the edge of the eyelid with the eye drop bottle or ointment tube when applying medicines to the affected eye. This will stop you from spreading the infection to the other eye or to other people. SEEK MEDICAL CARE IF:   Your symptoms do not improve with treatment.  You have increased pain.  Your vision becomes blurry.  You have a fever.  You have facial pain, redness, or swelling.  You have new symptoms.  Your symptoms get worse.   This information is not intended to replace advice given to you by your health care provider. Make sure you discuss any questions you have with your health care provider.   Document Released: 06/02/2002 Document Revised: 09/03/2005 Document Reviewed: 12/22/2013 Elsevier Interactive Patient Education 2016 [employer]  

## 2016-02-06 NOTE — Progress Notes (Signed)
Patient: Grace [name] Female    DOB: May 31, 1960   55 y.o.   MRN: [id number] Visit Date: 02/06/2016  Today's Provider: Trinna Post, PA-C   Chief Complaint  Patient presents with  . Conjunctivitis   Subjective:    Conjunctivitis   The current episode started today. The onset is undetermined. The problem has been unchanged. Associated symptoms include congestion, headaches, rhinorrhea, sore throat, cough, URI, eye pain and eye redness. Pertinent negatives include no fever, no wheezing and no eye discharge. The eye pain is mild. The left eye is affected. The eye pain is associated with movement. The eyelid exhibits redness.   Patient is a 54 y/o female presenting with the above symptoms. She says her left eye redness was sudden. She did not wake up this morning with eye redness, glued shut sensation, or discharge. She reports Uri symptoms including sore throat, sneezing, coughing, runny nose. She endorses a sense of fullness in her left eye. She has a mild headache.    Allergies  Allergen Reactions  . Nickel Rash     Current Outpatient Prescriptions:  .  hydrocortisone 2.5 % cream, APPLY 1 APPLICATION TOPICALLY TWO (2) TIMES A DAY., Disp: , Rfl: 0 .  ketoconazole (NIZORAL) 2 % cream, APPLY 1 APPLICATION TOPICALLY DAILY., Disp: , Rfl: 11 .  meloxicam (MOBIC) 15 MG tablet, Take 1 tablet (15 mg total) by mouth daily., Disp: 30 tablet, Rfl: 0 .  Polyethylene Glycol 3350 (MIRALAX PO), Take by mouth., Disp: , Rfl:  .  sulfamethoxazole-trimethoprim (BACTRIM DS,SEPTRA DS) 800-160 MG tablet, Take 1 tablet by mouth 2 (two) times daily., Disp: 14 tablet, Rfl: 0 .  benzonatate (TESSALON) 100 MG capsule, Take 1 capsule (100 mg total) by mouth 3 (three) times daily as needed for cough., Disp: 20 capsule, Rfl: 0 .  naproxen (NAPROSYN) 500 MG tablet, Take by mouth. Reported on 09/22/2015, Disp: , Rfl:   Review of Systems  Constitutional: Negative for fever.  HENT: Positive for  congestion, rhinorrhea and sore throat.   Eyes: Positive for pain and redness. Negative for discharge.  Respiratory: Positive for cough. Negative for wheezing.   Cardiovascular: Negative for chest pain, palpitations and leg swelling.  Neurological: Positive for headaches.    Social History  Substance Use Topics  . Smoking status: Never Smoker  . Smokeless tobacco: Never Used  . Alcohol use Yes     Comment: Occasional; once a month   Objective:   BP 130/62 (BP Location: Left Arm, Patient Position: Sitting, Cuff Size: Normal)   Pulse 89   Temp 98.9 F (37.2 C) (Oral)   Resp 16   Wt 221 lb 6.4 oz (100.4 kg)   SpO2 97%   BMI 34.68 kg/m   Physical Exam  Constitutional: She appears well-developed and well-nourished. No distress.  HENT:  Right Ear: External ear normal.  Left Ear: External ear normal.  Mouth/Throat: Oropharynx is clear and moist. No oropharyngeal exudate.  Eyes: EOM and lids are normal. Pupils are equal, round, and reactive to light. Lids are everted and swept, no foreign bodies found. Right eye exhibits no discharge. Left eye exhibits no discharge. Right conjunctiva has no hemorrhage. Left conjunctiva has a hemorrhage. Right eye exhibits no nystagmus. Left eye exhibits no nystagmus.    Full visual fields bilaterally. Vision in tact grossly, able to distinguish number of fingers. Orbits nontender to palpation.   Neck: Neck supple.  Cardiovascular: Normal rate and regular rhythm.   Pulmonary/Chest:  Effort normal and breath sounds normal. No respiratory distress. She has no rales.  Lymphadenopathy:    She has no cervical adenopathy.  Neurological:  Equal 5/5 strength in bilateral upper and lower extremities. No gait abnormalities.  Skin: Skin is warm and dry.  Psychiatric: She has a normal mood and affect. Her behavior is normal.        Assessment & Plan:      Problem List Items Addressed This Visit    None    Visit Diagnoses    Cough    -  Primary    Relevant Medications   benzonatate (TESSALON) 100 MG capsule   Subconjunctival hemorrhage of left eye         Patient is a 55 y/o female presenting with symptoms of subconjunctival hemorrhage. Sudden onset in context of URI symptoms consistent with likely subconjunctival hemorrhage 2/2 coughing. May progress into conjunctivitis. She may do warm compresses. Patient to call back with how she's feeling. Patient has no vascular history, neurological exam normal today. Patient counseled that if she has intractable headache, worsening of red eye, severe blurring of vision, nausea, vomiting that she is to proceed to the emergency room.   No Follow-up on file.   Patient Instructions  Viral Conjunctivitis Viral conjunctivitis is an inflammation of the clear membrane that covers the white part of your eye and the inner surface of your eyelid (conjunctiva). The inflammation is caused by a viral infection. The blood vessels in the conjunctiva become inflamed, causing the eye to become red or pink, and often itchy. Viral conjunctivitis can easily be passed from one person to another (contagious). CAUSES  Viral conjunctivitis is caused by a virus. A virus is a type of contagious germ. It can be spread by touching objects that have been contaminated with the virus, such as doorknobs or towels.  SYMPTOMS  Symptoms of viral conjunctivitis may include:   Eye redness.  Tearing or watery eyes.  Itchy eyes.  Burning feeling in the eyes.  Clear drainage from the eye.  Swollen eyelids.  A gritty feeling in the eye.  Light sensitivity. DIAGNOSIS  Viral conjunctivitis may be diagnosed with a medical history and physical exam. If you have discharge from your eye, the discharge may be tested to rule out other causes of conjunctivitis.  TREATMENT  Viral conjunctivitis does not respond to medicines that kill bacteria (antibiotics). Treatment for viral conjunctivitis is directed at stopping a bacterial  infection from developing in addition to the viral infection. Treatment also aims to relieve your symptoms, such as itching. This may be done with antihistamine drops or other eye medicines. HOME CARE INSTRUCTIONS  Take medicines only as directed by your health care provider.  Avoid touching or rubbing your eyes.  Apply a warm, clean washcloth to your eye for 10-20 minutes, 3-4 times per day.  If you wear contact lenses, do not wear them until the inflammation is gone and your health care provider says it is safe to wear them again. Ask your health care provider how to sterilize or replace your contact lenses before using them again. Wear glasses until you can resume wearing contacts.  Avoid wearing eye makeup until the inflammation is gone. Throw away any old eye cosmetics that may be contaminated.  Change or wash your pillowcase every day.  Do not share towels or washcloths. This may spread the infection.  Wash your hands often with soap and water. Use paper towels to dry your hands.  Gently wipe  away any drainage from your eye with a warm, wet washcloth or a cotton ball.  Be very careful to avoid touching the edge of the eyelid with the eye drop bottle or ointment tube when applying medicines to the affected eye. This will stop you from spreading the infection to the other eye or to other people. SEEK MEDICAL CARE IF:   Your symptoms do not improve with treatment.  You have increased pain.  Your vision becomes blurry.  You have a fever.  You have facial pain, redness, or swelling.  You have new symptoms.  Your symptoms get worse.   This information is not intended to replace advice given to you by your health care provider. Make sure you discuss any questions you have with your health care provider.   Document Released: 06/02/2002 Document Revised: 09/03/2005 Document Reviewed: 12/22/2013 Elsevier Interactive Patient Education [employer].    The entirety of the  information documented in the History of Present Illness, Review of Systems and Physical Exam were personally obtained by me. Portions of this information were initially documented by Nayab Aten and reviewed by me for thoroughness and accuracy.           Trinna Post, PA-C  [facility]

## 2016-02-12 ENCOUNTER — Other Ambulatory Visit: Payer: Self-pay | Admitting: Physician Assistant

## 2016-02-12 DIAGNOSIS — M7582 Other shoulder lesions, left shoulder: Secondary | ICD-10-CM

## 2016-05-23 ENCOUNTER — Ambulatory Visit: Payer: BLUE CROSS/BLUE SHIELD | Admitting: Physician Assistant

## 2016-05-25 ENCOUNTER — Encounter: Payer: Self-pay | Admitting: Physician Assistant

## 2016-05-25 ENCOUNTER — Ambulatory Visit (INDEPENDENT_AMBULATORY_CARE_PROVIDER_SITE_OTHER): Payer: BLUE CROSS/BLUE SHIELD | Admitting: Physician Assistant

## 2016-05-25 VITALS — BP 128/90 | HR 97 | Temp 98.3°F | Resp 16 | Wt 200.6 lb

## 2016-05-25 DIAGNOSIS — J0140 Acute pansinusitis, unspecified: Secondary | ICD-10-CM | POA: Diagnosis not present

## 2016-05-25 DIAGNOSIS — R0681 Apnea, not elsewhere classified: Secondary | ICD-10-CM

## 2016-05-25 DIAGNOSIS — R0683 Snoring: Secondary | ICD-10-CM | POA: Diagnosis not present

## 2016-05-25 DIAGNOSIS — R5383 Other fatigue: Secondary | ICD-10-CM | POA: Diagnosis not present

## 2016-05-25 DIAGNOSIS — R400 Somnolence: Secondary | ICD-10-CM | POA: Diagnosis not present

## 2016-05-25 MED ORDER — AZITHROMYCIN 250 MG PO TABS: 250.0000 mg | ORAL_TABLET | Freq: Every day | ORAL | 0 refills | Status: DC

## 2016-05-25 NOTE — Progress Notes (Signed)
Patient: Grace [name] Female    DOB: April 09, 1960   56 y.o.   MRN: [id number] Visit Date: 05/25/2016  Today's Provider: Mar Daring, PA-C   Chief Complaint  Patient presents with  . Sleep Apnea   Subjective:    HPI Patient here today c/o possible sleep apnea. Patient reports her boy friend has witnessed her apnea during the night. Patient reports she is feeling fatigue all the time. She is having daytime somnolence and can fall asleep when sitting still. She shared a video with me in the office today where she had an episode of apnea that lasted about 20 seconds. There is loud snoring and gasping for air noted as well. She also awakes with a headache daily. Epworth score today was 20. BMI is 31.   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 Patient reports that her symptoms are still present. Patient reports pain to swallow solids.     Allergies  Allergen Reactions  . Nickel Rash     Current Outpatient Prescriptions:  .  azithromycin (ZITHROMAX) 250 MG tablet, Take 250 mg by mouth daily., Disp: , Rfl:  .  ketoconazole (NIZORAL) 2 % cream, APPLY 1 APPLICATION TOPICALLY DAILY., Disp: , Rfl: 11 .  meloxicam (MOBIC) 15 MG tablet, TAKE 1 TABLET (15 MG TOTAL) BY MOUTH DAILY., Disp: 30 tablet, Rfl: 0 .  naproxen (NAPROSYN) 500 MG tablet, Take by mouth. Reported on 09/22/2015, Disp: , Rfl:  .  Polyethylene Glycol 3350 (MIRALAX PO), Take by mouth., Disp: , Rfl:   Review of Systems  Constitutional: Positive for fatigue.  HENT: Positive for postnasal drip and sore throat.   Respiratory: Positive for cough.   Cardiovascular: Negative.   Gastrointestinal: Negative.   Neurological: Positive for headaches.  Psychiatric/Behavioral: Negative.     Social History  Substance Use Topics  . Smoking status: Never Smoker  . Smokeless tobacco: Never Used  . Alcohol use Yes     Comment: Occasional; once a month    Objective:   There were no vitals taken for this visit. There were no vitals filed for this visit.   Physical Exam  Constitutional: She appears well-developed and well-nourished. No distress.  Neck: Normal range of motion. Neck supple. No JVD present. No tracheal deviation present. No thyromegaly present.  Cardiovascular: Normal rate, regular rhythm and normal heart sounds.  Exam reveals no gallop and no friction rub.   No murmur heard. Pulmonary/Chest: Effort normal and breath sounds normal. No respiratory distress. She has no wheezes. She has no rales.  Musculoskeletal: She exhibits no edema.  Lymphadenopathy:    She has no cervical adenopathy.  Skin: She is not diaphoretic.  Psychiatric: She has a normal mood and affect. Her behavior is normal. Judgment and thought content normal.  Vitals reviewed.      Assessment & Plan:     1. Witnessed episode of apnea Worsening sleep with daytime somnolence, fatigue, and morning headaches with witnessed apnea and snoring. Epworth score was 20. Will refer as below for sleep study. I will f/u pending results.  - Ambulatory referral to Sleep Studies  2. Snoring See above medical treatment plan. - Ambulatory referral to Sleep Studies  3. Daytime somnolence See above medical treatment plan. - Ambulatory referral to Sleep Studies  4. Fatigue, unspecified type See above medical treatment plan. - Ambulatory referral to Sleep Studies  5. Acute non-recurrent pansinusitis  Worsening symptoms that have not responded to OTC medications. Will give second round of zpak as below. Continue allergy medications. Stay well hydrated and get plenty of rest. Call if no symptom improvement or if symptoms worsen. - azithromycin (ZITHROMAX) 250 MG tablet; Take 1 tablet (250 mg total) by mouth daily.  Dispense: 6 each; Refill: 0       Mar Daring, PA-C  [facility]

## 2016-05-25 NOTE — Patient Instructions (Signed)

## 2016-05-29 ENCOUNTER — Telehealth: Payer: Self-pay | Admitting: Physician Assistant

## 2016-05-29 DIAGNOSIS — J069 Acute upper respiratory infection, unspecified: Secondary | ICD-10-CM

## 2016-05-29 MED ORDER — AMOXICILLIN-POT CLAVULANATE 875-125 MG PO TABS: 1.0000 | ORAL_TABLET | Freq: Two times a day (BID) | ORAL | 0 refills | Status: DC

## 2016-05-29 NOTE — Telephone Encounter (Signed)
Pt states she was seen last week for cough and sore throat.  Pt states she is not any better.  Pt states she is still having a cough and sore throat.  Pt is asking if she will a different Rx.  [facility].  CB#[phone]/MW

## 2016-05-29 NOTE — Telephone Encounter (Signed)
Augmentin sent to [facility]

## 2016-05-31 ENCOUNTER — Encounter: Payer: Self-pay | Admitting: Physician Assistant

## 2016-06-14 ENCOUNTER — Telehealth: Payer: Self-pay | Admitting: Physician Assistant

## 2016-06-14 NOTE — Telephone Encounter (Signed)
We can notify patient of this.

## 2016-06-14 NOTE — Telephone Encounter (Signed)
Patient advised as below. Patient reports that she has not received any thing from her insurance and has tried to call the sleep lab. Patient reports she has not had any response. Patient is requesting [place] to follow up with referral.

## 2016-06-14 NOTE — Telephone Encounter (Signed)
Please review

## 2016-06-14 NOTE — Telephone Encounter (Signed)
Pt is requesting a call back from [place] about her sleep study being scheduled.  XL#[phone]/XY  I have spoke to Sarah with referrals and the referral has been sent.  Per Judson Roch this was sent to [facility] and insurance authorization is still pending/MW

## 2016-06-14 NOTE — Telephone Encounter (Signed)
Noted. Thank you. We will notify her once we have approval from the insurance company and the sleep center will contact her to schedule this appt. It is in the works, there is nothing else we can do at this time.

## 2016-06-22 ENCOUNTER — Telehealth: Payer: Self-pay | Admitting: Physician Assistant

## 2016-06-22 ENCOUNTER — Ambulatory Visit (INDEPENDENT_AMBULATORY_CARE_PROVIDER_SITE_OTHER): Payer: BLUE CROSS/BLUE SHIELD | Admitting: Physician Assistant

## 2016-06-22 ENCOUNTER — Encounter: Payer: Self-pay | Admitting: Physician Assistant

## 2016-06-22 VITALS — BP 140/70 | HR 90 | Temp 98.3°F | Resp 16 | Ht 67.0 in | Wt 199.4 lb

## 2016-06-22 DIAGNOSIS — Z0000 Encounter for general adult medical examination without abnormal findings: Secondary | ICD-10-CM

## 2016-06-22 DIAGNOSIS — Z13220 Encounter for screening for lipoid disorders: Secondary | ICD-10-CM

## 2016-06-22 DIAGNOSIS — Z1239 Encounter for other screening for malignant neoplasm of breast: Secondary | ICD-10-CM

## 2016-06-22 DIAGNOSIS — R0681 Apnea, not elsewhere classified: Secondary | ICD-10-CM

## 2016-06-22 DIAGNOSIS — R400 Somnolence: Secondary | ICD-10-CM

## 2016-06-22 DIAGNOSIS — Z1159 Encounter for screening for other viral diseases: Secondary | ICD-10-CM

## 2016-06-22 DIAGNOSIS — Z1231 Encounter for screening mammogram for malignant neoplasm of breast: Secondary | ICD-10-CM

## 2016-06-22 DIAGNOSIS — M545 Low back pain: Secondary | ICD-10-CM

## 2016-06-22 DIAGNOSIS — Z136 Encounter for screening for cardiovascular disorders: Secondary | ICD-10-CM | POA: Diagnosis not present

## 2016-06-22 DIAGNOSIS — Z1272 Encounter for screening for malignant neoplasm of vagina: Secondary | ICD-10-CM | POA: Diagnosis not present

## 2016-06-22 DIAGNOSIS — G8929 Other chronic pain: Secondary | ICD-10-CM | POA: Diagnosis not present

## 2016-06-22 DIAGNOSIS — Z833 Family history of diabetes mellitus: Secondary | ICD-10-CM | POA: Diagnosis not present

## 2016-06-22 DIAGNOSIS — Z8619 Personal history of other infectious and parasitic diseases: Secondary | ICD-10-CM

## 2016-06-22 MED ORDER — NAPROXEN 500 MG PO TABS: 500.0000 mg | ORAL_TABLET | Freq: Two times a day (BID) | ORAL | 3 refills | Status: DC

## 2016-06-22 NOTE — Progress Notes (Signed)
Patient: Grace [name], Female    DOB: 1961/02/21, 56 y.o.   MRN: [id number] Visit Date: 06/22/2016  Today's Provider: Mar Daring, PA-C   No chief complaint on file.  Subjective:    Annual physical exam Grace [name] is a 56 y.o. female who presents today for health maintenance and complete physical. She feels well. She reports exercising none. She reports she is sleeping poorly. She reports that she can't breathe.  She is requesting refill Naproxen.  Mammogram:10/28/15 BI-RADS 1 Colonoscopy: 11/09/13 Polyps, Hyperplastic polyp, negative for Dysplasia and malignancy. -----------------------------------------------------------------   Review of Systems  Constitutional: Negative.        Sleepy  HENT: Negative.   Eyes: Negative.   Respiratory: Positive for cough.   Cardiovascular: Negative.   Gastrointestinal: Negative.   Endocrine: Negative.   Genitourinary: Negative.   Musculoskeletal: Negative.   Skin: Negative.   Allergic/Immunologic: Negative.   Neurological: Negative.   Hematological: Negative.   Psychiatric/Behavioral: Negative.     Social History      She  reports that she has never smoked. She has never used smokeless tobacco. She reports that she drinks alcohol. She reports that she does not use drugs.       Social History   Social History  . Marital status: Divorced    Spouse name: N/A  . Number of children: N/A  . Years of education: N/A   Social History Main Topics  . Smoking status: Never Smoker  . Smokeless tobacco: Never Used  . Alcohol use Yes     Comment: Occasional; once a month  . Drug use: No  . Sexual activity: Not Asked   Other Topics Concern  . None   Social History Narrative  . None    Past Medical History:  Diagnosis Date  . Anemia   . Hx of chlamydia infection   . Ovarian cyst      Patient Active Problem List   Diagnosis Date Noted  . Anemia 09/15/2015  . 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 C.  trachomatis infection 09/15/2015  . Candida vaginitis 09/15/2015  . Cyst of ovary 09/15/2015  . Mass of ovary 09/15/2015  . Adnexal pain 09/15/2015  . 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 ABDOMINAL HYSTERECTOMY  2006   not due to cancer-Partial  . Elbow adjustment Right 03/30/2011   had to "pop" back into place  . TOTAL ABDOMINAL HYSTERECTOMY W/ BILATERAL SALPINGOOPHORECTOMY  09/07/2013   with Dr. Romelle Starcher    Family History        Family Status  Relation Status  . Mother Deceased   Hx of stroke, high blood pressure,and diabetes  . Father Deceased   cause of death was bone cancer  . Sister Alive  . Sister Alive  . Sister Alive        Her family history includes Asthma in her sister; Hypertension in her sister and sister.     Allergies  Allergen Reactions  . Nickel Rash     Current Outpatient Prescriptions:  .  ketoconazole (NIZORAL) 2 % cream, APPLY 1 APPLICATION TOPICALLY DAILY., Disp: , Rfl: 11 .  meloxicam (MOBIC) 15 MG tablet, TAKE 1 TABLET (15 MG TOTAL) BY MOUTH DAILY., Disp: 30 tablet, Rfl: 0 .  naproxen (NAPROSYN) 500 MG tablet, Take by mouth. Reported on 09/22/2015, Disp: , Rfl:  .  Polyethylene Glycol 3350 (MIRALAX PO), Take by mouth., Disp: , Rfl:  .  amoxicillin-clavulanate (AUGMENTIN) 875-125 MG tablet, Take 1 tablet by mouth 2 (two) times daily. (Patient not taking: Reported on 06/22/2016), Disp: 20 tablet, Rfl: 0   Patient Care Team: Mar Daring, PA-C as PCP - General (Family Medicine)      Objective:   Vitals: BP 140/70 (BP Location: Right Arm, Patient Position: Sitting, Cuff Size: Normal)   Pulse 90   Temp 98.3 F (36.8 C) (Oral)   Resp 16   Ht 5\' 7"  (1.702 m)   Wt 199 lb 6.4 oz (90.4 kg)   BMI 31.23 kg/m    Physical Exam  Constitutional: She is oriented to person, place, and time. She appears well-developed and well-nourished. No distress.  HENT:  Head: Normocephalic and  atraumatic.  Right Ear: Hearing, tympanic membrane, external ear and ear canal normal.  Left Ear: Hearing, tympanic membrane, external ear and ear canal normal.  Nose: Nose normal.  Mouth/Throat: Uvula is midline, oropharynx is clear and moist and mucous membranes are normal. No oropharyngeal exudate.  Eyes: Conjunctivae and EOM are normal. Pupils are equal, round, and reactive to light. Right eye exhibits no discharge. Left eye exhibits no discharge. No scleral icterus.  Neck: Normal range of motion. Neck supple. No JVD present. Carotid bruit is not present. No tracheal deviation present. No thyromegaly present.  Cardiovascular: Normal rate, regular rhythm, normal heart sounds and intact distal pulses.  Exam reveals no gallop and no friction rub.   No murmur heard. Pulmonary/Chest: Effort normal and breath sounds normal. No respiratory distress. She has no wheezes. She has no rales. She exhibits no tenderness. Right breast exhibits no inverted nipple, no mass, no nipple discharge, no skin change and no tenderness. Left breast exhibits no inverted nipple, no mass, no nipple discharge, no skin change and no tenderness. Breasts are symmetrical.  Abdominal: Soft. Bowel sounds are normal. She exhibits no distension and no mass. There is no tenderness. There is no rebound and no guarding. Hernia confirmed negative in the right inguinal area and confirmed negative in the left inguinal area.  Genitourinary: Rectum normal, vagina normal and uterus normal. No breast swelling, tenderness, discharge or bleeding. Pelvic exam was performed with patient supine. There is no rash, tenderness, lesion or injury on the right labia. There is no rash, tenderness, lesion or injury on the left labia. Cervix exhibits no motion tenderness, no discharge and no friability. Right adnexum displays no mass, no tenderness and no fullness. Left adnexum displays no mass, no tenderness and no fullness. No erythema, tenderness or bleeding  in the vagina. No signs of injury around the vagina. No vaginal discharge found.  Musculoskeletal: Normal range of motion. She exhibits no edema or tenderness.  Lymphadenopathy:    She has no cervical adenopathy.       Right: No inguinal adenopathy present.       Left: No inguinal adenopathy present.  Neurological: She is alert and oriented to person, place, and time. She has normal reflexes. No cranial nerve deficit. Coordination normal.  Skin: Skin is warm and dry. No rash noted. She is not diaphoretic.  Psychiatric: She has a normal mood and affect. Her behavior is normal. Judgment and thought content normal.  Vitals reviewed.    Depression Screen PHQ 2/9 Scores 06/22/2016  PHQ - 2 Score 0  PHQ- 9 Score 2     Assessment & Plan:     Routine Health Maintenance and Physical Exam  Exercise Activities and Dietary recommendations Goals    None  There is no immunization history on file for this patient.  Health Maintenance  Topic Date Due  . Hepatitis C Screening  04-Apr-1960  . HIV Screening  02/24/1977  . TETANUS/TDAP  02/24/1981  . PAP SMEAR  02/25/1983  . COLONOSCOPY  02/25/2012  . INFLUENZA VACCINE  10/25/2015  . MAMMOGRAM  10/27/2017     Discussed health benefits of physical activity, and encouraged her to engage in regular exercise appropriate for her age and condition.    1. Annual physical exam Normal physical exam today. Will check labs as below and f/u pending lab results. If labs are stable and WNL she will not need to have these rechecked for one year at her next annual physical exam. She is to call the office in the meantime if she has any acute issue, questions or concerns. - CBC with Differential/Platelet - Comprehensive metabolic panel - TSH  2. Breast cancer screening Breast exam today was normal. There is no family history of breast cancer. She does perform regular self breast exams. Mammogram was ordered as below. Information for [facility]  Breast clinic was given to patient so she may schedule her mammogram at her convenience. - MM DIGITAL SCREENING BILATERAL; Future  3. Screening for vaginal cancer High risk sexual behavior in the past and has had chlamydia in 2015. Patient is s/p hysterectomy. Pap collected today. Will send as below and f/u pending results. - Pap IG, CT/NG w/ reflex HPV when ASC-U  4. History of chlamydia In 2015. Will recheck as below and f/u pending results.  - Pap IG, CT/NG w/ reflex HPV when ASC-U  5. Witnessed episode of apnea Still having apnea and daytime somnolence. Sleep study referral has been made, approved through insurance and sent records on 06/13/16. Advised patient that Sleep center should be in contact soon to schedule.   6. Daytime somnolence See above medical treatment plan.  7. Chronic bilateral low back pain without sciatica Stable. Diagnosis pulled for medication refill. Continue current medical treatment plan. - naproxen (NAPROSYN) 500 MG tablet; Take 1 tablet (500 mg total) by mouth 2 (two) times daily with a meal. Reported on 09/22/2015  Dispense: 60 tablet; Refill: 3  8. Need for hepatitis C screening test - Hepatitis C antibody  9. Family history of diabetes mellitus (DM) Will check labs as below and f/u pending results. - Hemoglobin A1c  10. Encounter for lipid screening for cardiovascular disease Will check labs as below and f/u pending results. - Lipid panel  --------------------------------------------------------------------    Mar Daring, PA-C  [facility]

## 2016-06-22 NOTE — Telephone Encounter (Signed)
Pt called to say she was in today and was sent to get labs but [facility] said she had an outstanding balance she needed to pay before she could have service there.  Thank sTeri

## 2016-06-22 NOTE — Telephone Encounter (Signed)
She can have done at the hospital lab if she wishes.

## 2016-06-22 NOTE — Telephone Encounter (Signed)
FYI

## 2016-06-22 NOTE — Patient Instructions (Signed)
Health Maintenance for Postmenopausal Women Menopause is a normal process in which your reproductive ability comes to an end. This process happens gradually over a span of months to years, usually between the ages of 33 and 38. Menopause is complete when you have missed 12 consecutive menstrual periods. It is important to talk with your health care provider about some of the most common conditions that affect postmenopausal women, such as heart disease, cancer, and bone loss (osteoporosis). Adopting a healthy lifestyle and getting preventive care can help to promote your health and wellness. Those actions can also lower your chances of developing some of these common conditions. What should I know about menopause? During menopause, you may experience a number of symptoms, such as:  Moderate-to-severe hot flashes.  Night sweats.  Decrease in sex drive.  Mood swings.  Headaches.  Tiredness.  Irritability.  Memory problems.  Insomnia. Choosing to treat or not to treat menopausal changes is an individual decision that you make with your health care provider. What should I know about hormone replacement therapy and supplements? Hormone therapy products are effective for treating symptoms that are associated with menopause, such as hot flashes and night sweats. Hormone replacement carries certain risks, especially as you become older. If you are thinking about using estrogen or estrogen with progestin treatments, discuss the benefits and risks with your health care provider. What should I know about heart disease and stroke? Heart disease, heart attack, and stroke become more likely as you age. This may be due, in part, to the hormonal changes that your body experiences during menopause. These can affect how your body processes dietary fats, triglycerides, and cholesterol. Heart attack and stroke are both medical emergencies. There are many things that you can do to help prevent heart disease  and stroke:  Have your blood pressure checked at least every 1-2 years. High blood pressure causes heart disease and increases the risk of stroke.  If you are 48-61 years old, ask your health care provider if you should take aspirin to prevent a heart attack or a stroke.  Do not use any tobacco products, including cigarettes, chewing tobacco, or electronic cigarettes. If you need help quitting, ask your health care provider.  It is important to eat a healthy diet and maintain a healthy weight.  Be sure to include plenty of vegetables, fruits, low-fat dairy products, and lean protein.  Avoid eating foods that are high in solid fats, added sugars, or salt (sodium).  Get regular exercise. This is one of the most important things that you can do for your health.  Try to exercise for at least 150 minutes each week. The type of exercise that you do should increase your heart rate and make you sweat. This is known as moderate-intensity exercise.  Try to do strengthening exercises at least twice each week. Do these in addition to the moderate-intensity exercise.  Know your numbers.Ask your health care provider to check your cholesterol and your blood glucose. Continue to have your blood tested as directed by your health care provider. What should I know about cancer screening? There are several types of cancer. Take the following steps to reduce your risk and to catch any cancer development as early as possible. Breast Cancer  Practice breast self-awareness.  This means understanding how your breasts normally appear and feel.  It also means doing regular breast self-exams. Let your health care provider know about any changes, no matter how small.  If you are 40 or older,  have a clinician do a breast exam (clinical breast exam or CBE) every year. Depending on your age, family history, and medical history, it may be recommended that you also have a yearly breast X-ray (mammogram).  If you  have a family history of breast cancer, talk with your health care provider about genetic screening.  If you are at high risk for breast cancer, talk with your health care provider about having an MRI and a mammogram every year.  Breast cancer (BRCA) gene test is recommended for women who have family members with BRCA-related cancers. Results of the assessment will determine the need for genetic counseling and BRCA1 and for BRCA2 testing. BRCA-related cancers include these types:  Breast. This occurs in males or females.  Ovarian.  Tubal. This may also be called fallopian tube cancer.  Cancer of the abdominal or pelvic lining (peritoneal cancer).  Prostate.  Pancreatic. Cervical, Uterine, and Ovarian Cancer  Your health care provider may recommend that you be screened regularly for cancer of the pelvic organs. These include your ovaries, uterus, and vagina. This screening involves a pelvic exam, which includes checking for microscopic changes to the surface of your cervix (Pap test).  For women ages 21-65, health care providers may recommend a pelvic exam and a Pap test every three years. For women ages 23-65, they may recommend the Pap test and pelvic exam, combined with testing for human papilloma virus (HPV), every five years. Some types of HPV increase your risk of cervical cancer. Testing for HPV may also be done on women of any age who have unclear Pap test results.  Other health care providers may not recommend any screening for nonpregnant women who are considered low risk for pelvic cancer and have no symptoms. Ask your health care provider if a screening pelvic exam is right for you.  If you have had past treatment for cervical cancer or a condition that could lead to cancer, you need Pap tests and screening for cancer for at least 20 years after your treatment. If Pap tests have been discontinued for you, your risk factors (such as having a new sexual partner) need to be reassessed  to determine if you should start having screenings again. Some women have medical problems that increase the chance of getting cervical cancer. In these cases, your health care provider may recommend that you have screening and Pap tests more often.  If you have a family history of uterine cancer or ovarian cancer, talk with your health care provider about genetic screening.  If you have vaginal bleeding after reaching menopause, tell your health care provider.  There are currently no reliable tests available to screen for ovarian cancer. Lung Cancer  Lung cancer screening is recommended for adults 99-83 years old who are at high risk for lung cancer because of a history of smoking. A yearly low-dose CT scan of the lungs is recommended if you:  Currently smoke.  Have a history of at least 30 pack-years of smoking and you currently smoke or have quit within the past 15 years. A pack-year is smoking an average of one pack of cigarettes per day for one year. Yearly screening should:  Continue until it has been 15 years since you quit.  Stop if you develop a health problem that would prevent you from having lung cancer treatment. Colorectal Cancer  This type of cancer can be detected and can often be prevented.  Routine colorectal cancer screening usually begins at age 72 and continues  through age 75.  If you have risk factors for colon cancer, your health care provider may recommend that you be screened at an earlier age.  If you have a family history of colorectal cancer, talk with your health care provider about genetic screening.  Your health care provider may also recommend using home test kits to check for hidden blood in your stool.  A small camera at the end of a tube can be used to examine your colon directly (sigmoidoscopy or colonoscopy). This is done to check for the earliest forms of colorectal cancer.  Direct examination of the colon should be repeated every 5-10 years until  age 75. However, if early forms of precancerous polyps or small growths are found or if you have a family history or genetic risk for colorectal cancer, you may need to be screened more often. Skin Cancer  Check your skin from head to toe regularly.  Monitor any moles. Be sure to tell your health care provider:  About any new moles or changes in moles, especially if there is a change in a mole's shape or color.  If you have a mole that is larger than the size of a pencil eraser.  If any of your family members has a history of skin cancer, especially at a young age, talk with your health care provider about genetic screening.  Always use sunscreen. Apply sunscreen liberally and repeatedly throughout the day.  Whenever you are outside, protect yourself by wearing long sleeves, pants, a wide-brimmed hat, and sunglasses. What should I know about osteoporosis? Osteoporosis is a condition in which bone destruction happens more quickly than new bone creation. After menopause, you may be at an increased risk for osteoporosis. To help prevent osteoporosis or the bone fractures that can happen because of osteoporosis, the following is recommended:  If you are 19-50 years old, get at least 1,000 mg of calcium and at least 600 mg of vitamin D per day.  If you are older than age 50 but younger than age 70, get at least 1,200 mg of calcium and at least 600 mg of vitamin D per day.  If you are older than age 70, get at least 1,200 mg of calcium and at least 800 mg of vitamin D per day. Smoking and excessive alcohol intake increase the risk of osteoporosis. Eat foods that are rich in calcium and vitamin D, and do weight-bearing exercises several times each week as directed by your health care provider. What should I know about how menopause affects my mental health? Depression may occur at any age, but it is more common as you become older. Common symptoms of depression include:  Low or sad  mood.  Changes in sleep patterns.  Changes in appetite or eating patterns.  Feeling an overall lack of motivation or enjoyment of activities that you previously enjoyed.  Frequent crying spells. Talk with your health care provider if you think that you are experiencing depression. What should I know about immunizations? It is important that you get and maintain your immunizations. These include:  Tetanus, diphtheria, and pertussis (Tdap) booster vaccine.  Influenza every year before the flu season begins.  Pneumonia vaccine.  Shingles vaccine. Your health care provider may also recommend other immunizations. This information is not intended to replace advice given to you by your health care provider. Make sure you discuss any questions you have with your health care provider. Document Released: 05/04/2005 Document Revised: 09/30/2015 Document Reviewed: 12/14/2014 Elsevier Interactive Patient   Education  2017 [employer] Inc.  

## 2016-06-25 ENCOUNTER — Telehealth: Payer: Self-pay

## 2016-06-25 ENCOUNTER — Other Ambulatory Visit
Admission: RE | Admit: 2016-06-25 | Discharge: 2016-06-25 | Disposition: A | Discharge status: Home / Self Care | Payer: No Typology Code available for payment source | Source: Ambulatory Visit | Attending: Physician Assistant | Admitting: Physician Assistant

## 2016-06-25 DIAGNOSIS — Z0000 Encounter for general adult medical examination without abnormal findings: Secondary | ICD-10-CM | POA: Insufficient documentation

## 2016-06-25 DIAGNOSIS — Z1159 Encounter for screening for other viral diseases: Secondary | ICD-10-CM | POA: Insufficient documentation

## 2016-06-25 DIAGNOSIS — Z136 Encounter for screening for cardiovascular disorders: Secondary | ICD-10-CM | POA: Insufficient documentation

## 2016-06-25 DIAGNOSIS — Z833 Family history of diabetes mellitus: Secondary | ICD-10-CM | POA: Insufficient documentation

## 2016-06-25 DIAGNOSIS — Z13220 Encounter for screening for lipoid disorders: Secondary | ICD-10-CM | POA: Insufficient documentation

## 2016-06-25 LAB — COMPREHENSIVE METABOLIC PANEL
ALK PHOS: 70 U/L (ref 38–126)
ALT: 14 U/L (ref 14–54)
ANION GAP: 4 — AB (ref 5–15)
AST: 16 U/L (ref 15–41)
Albumin: 4 g/dL (ref 3.5–5.0)
BILIRUBIN TOTAL: 0.3 mg/dL (ref 0.3–1.2)
BUN: 12 mg/dL (ref 6–20)
CALCIUM: 9.4 mg/dL (ref 8.9–10.3)
CO2: 27 mmol/L (ref 22–32)
Chloride: 109 mmol/L (ref 101–111)
Creatinine, Ser: 0.76 mg/dL (ref 0.44–1.00)
GFR calc non Af Amer: >60 mL/min (ref >60)
Glucose, Bld: 99 mg/dL (ref 65–99)
POTASSIUM: 3.9 mmol/L (ref 3.5–5.1)
SODIUM: 140 mmol/L (ref 135–145)
TOTAL PROTEIN: 7.4 g/dL (ref 6.5–8.1)

## 2016-06-25 LAB — PAP IG, CT-NG, RFX HPV ASCU
Chlamydia, Nuc. Acid Amp: NEGATIVE
GONOCOCCUS BY NUCLEIC ACID AMP: NEGATIVE
PAP Smear Comment: 0

## 2016-06-25 LAB — CBC WITH DIFFERENTIAL/PLATELET
BASOS PCT: 1 %
Basophils Absolute: 0 10*3/uL (ref 0–0.1)
Eosinophils Absolute: 0.1 10*3/uL (ref 0–0.7)
Eosinophils Relative: 3 %
HEMATOCRIT: 36.3 % (ref 35.0–47.0)
HEMOGLOBIN: 12 g/dL (ref 12.0–16.0)
LYMPHS ABS: 1.5 10*3/uL (ref 1.0–3.6)
Lymphocytes Relative: 32 %
MCH: 26.8 pg (ref 26.0–34.0)
MCHC: 33.2 g/dL (ref 32.0–36.0)
MCV: 80.8 fL (ref 80.0–100.0)
Monocytes Absolute: 0.4 10*3/uL (ref 0.2–0.9)
Monocytes Relative: 9 %
NEUTROS ABS: 2.5 10*3/uL (ref 1.4–6.5)
Neutrophils Relative %: 55 %
PLATELETS: 246 10*3/uL (ref 150–440)
RBC: 4.49 MIL/uL (ref 3.80–5.20)
RDW: 13.6 % (ref 11.5–14.5)
WBC: 4.5 10*3/uL (ref 3.6–11.0)

## 2016-06-25 LAB — LIPID PANEL
CHOL/HDL RATIO: 4.4 ratio
CHOLESTEROL: 191 mg/dL (ref 0–200)
HDL: 43 mg/dL (ref >40)
LDL Cholesterol: 131 mg/dL — ABNORMAL HIGH (ref 0–99)
TRIGLYCERIDES: 85 mg/dL (ref <150)
VLDL: 17 mg/dL (ref 0–40)

## 2016-06-25 LAB — TSH: TSH: 1.083 u[IU]/mL (ref 0.350–4.500)

## 2016-06-25 NOTE — Telephone Encounter (Signed)
-----   Message from Mar Daring, [place] sent at 06/25/2016  1:42 PM EDT ----- Pap is negative, GC/Chlamydia, HPV negative.

## 2016-06-25 NOTE — Telephone Encounter (Signed)
Advised  ED 

## 2016-06-25 NOTE — Telephone Encounter (Signed)
Patient advised as below.  

## 2016-06-26 ENCOUNTER — Telehealth: Payer: Self-pay

## 2016-06-26 LAB — HEMOGLOBIN A1C
HEMOGLOBIN A1C: 6.1 % — AB (ref 4.8–5.6)
Mean Plasma Glucose: 128 mg/dL

## 2016-06-26 LAB — HEPATITIS C ANTIBODY

## 2016-06-26 NOTE — Telephone Encounter (Signed)
Patient advised as directed below.  Thanks,  -Donalda Job 

## 2016-06-26 NOTE — Telephone Encounter (Signed)
LMTCB  Thanks,  -Joseline 

## 2016-06-26 NOTE — Telephone Encounter (Signed)
-----   Message from Mar Daring, PA-C sent at 06/26/2016  8:18 AM EDT ----- All labs are within normal limits and stable.  Thanks! -JB

## 2016-07-05 ENCOUNTER — Telehealth: Payer: Self-pay | Admitting: Physician Assistant

## 2016-07-05 DIAGNOSIS — R5383 Other fatigue: Secondary | ICD-10-CM

## 2016-07-05 DIAGNOSIS — R400 Somnolence: Secondary | ICD-10-CM

## 2016-07-05 DIAGNOSIS — R0681 Apnea, not elsewhere classified: Secondary | ICD-10-CM

## 2016-07-05 DIAGNOSIS — R0683 Snoring: Secondary | ICD-10-CM

## 2016-07-05 NOTE — Telephone Encounter (Signed)
Order placed

## 2016-07-05 NOTE — Telephone Encounter (Signed)
Please notify patient her insurance has denied an in-lab study. If she is willing I will apply for her to have a home study.

## 2016-07-05 NOTE — Telephone Encounter (Signed)
Patient advised as directed below. Patient wants to try the home study.  Thanks,  -Joseline

## 2016-10-22 ENCOUNTER — Encounter: Payer: Self-pay | Admitting: Physician Assistant

## 2016-10-22 ENCOUNTER — Telehealth: Payer: Self-pay | Admitting: Physician Assistant

## 2016-10-22 NOTE — Telephone Encounter (Signed)
Pt is requesting a call back to discuss her in home sleep study.  YE#[phone]/[place]

## 2016-10-22 NOTE — Telephone Encounter (Signed)
Please Review.  Thanks,  -Jocilynn Grade 

## 2016-10-23 NOTE — Telephone Encounter (Signed)
Spoke with patient and answered all questions.

## 2016-10-23 NOTE — Telephone Encounter (Signed)
Pt has called back regarding question about the sleep study and about using her friends CPAP.    Her call back is  [phone]  thanks teri

## 2016-10-26 ENCOUNTER — Telehealth: Payer: Self-pay | Admitting: Physician Assistant

## 2016-10-26 NOTE — Telephone Encounter (Signed)
They can use any face pillow or nasal pillow for her that they have.

## 2016-10-26 NOTE — Telephone Encounter (Signed)
Pt stated that [facility] advised pt that they don't have the CPAP pillow that [facility] sent an order. Pt is requesting for the order to be sent somewhere that carries the pillow. Please advise. Thanks TNP

## 2016-10-26 NOTE — Telephone Encounter (Signed)
I called [name] and spoke to [place], she reports that patient received all her supplies today. sd

## 2016-11-02 ENCOUNTER — Ambulatory Visit
Admission: RE | Admit: 2016-11-02 | Discharge: 2016-11-02 | Disposition: A | Discharge status: Home / Self Care | Payer: BLUE CROSS/BLUE SHIELD | Source: Ambulatory Visit | Attending: Physician Assistant | Admitting: Physician Assistant

## 2016-11-02 DIAGNOSIS — Z1239 Encounter for other screening for malignant neoplasm of breast: Secondary | ICD-10-CM

## 2016-11-02 DIAGNOSIS — Z1231 Encounter for screening mammogram for malignant neoplasm of breast: Secondary | ICD-10-CM | POA: Diagnosis present

## 2016-11-05 ENCOUNTER — Telehealth: Payer: Self-pay

## 2016-11-05 NOTE — Telephone Encounter (Signed)
lmtcb

## 2016-11-05 NOTE — Telephone Encounter (Signed)
Pt is returning call.  CB#[phone] until 1/MW

## 2016-11-05 NOTE — Telephone Encounter (Signed)
-----   Message from Mar Daring, [place] sent at 11/02/2016  3:37 PM EDT ----- Normal mammogram. Repeat screening in one year.

## 2016-11-05 NOTE — Telephone Encounter (Signed)
LM

## 2016-11-06 NOTE — Telephone Encounter (Signed)
Advised patient as below.  

## 2016-12-21 ENCOUNTER — Ambulatory Visit (INDEPENDENT_AMBULATORY_CARE_PROVIDER_SITE_OTHER): Payer: BLUE CROSS/BLUE SHIELD | Admitting: Physician Assistant

## 2016-12-21 ENCOUNTER — Encounter: Payer: Self-pay | Admitting: Physician Assistant

## 2016-12-21 VITALS — BP 140/80 | HR 73 | Temp 98.2°F | Resp 16 | Wt 219.0 lb

## 2016-12-21 DIAGNOSIS — G4733 Obstructive sleep apnea (adult) (pediatric): Secondary | ICD-10-CM | POA: Diagnosis not present

## 2016-12-21 DIAGNOSIS — R079 Chest pain, unspecified: Secondary | ICD-10-CM | POA: Diagnosis not present

## 2016-12-21 DIAGNOSIS — Z9989 Dependence on other enabling machines and devices: Secondary | ICD-10-CM

## 2016-12-21 NOTE — Progress Notes (Signed)
Patient: [name] Female    DOB: [birth year]   56 y.o.   MRN: [id number] Visit Date: 12/21/2016  Today's Provider: Mar Daring, PA-C   Chief Complaint  Patient presents with  . Follow-up    Sleep Apnea   Subjective:    Chest Pain   This is a recurrent problem. Episode onset: worsening in the last week. The onset quality is sudden. The problem occurs daily. The problem has been gradually worsening. Pain location: Under left breast.Does not radiate. The pain is at a severity of 10/10. The pain is severe. The quality of the pain is described as stabbing. The pain does not radiate. Associated symptoms include exertional chest pressure ("when it hits it feels like an Elephant"), irregular heartbeat, leg pain (swelling,left), malaise/fatigue, nausea, numbness (on left arm sometimes), palpitations, shortness of breath and weakness. Pertinent negatives include no abdominal pain, back pain, dizziness, fever, headaches or vomiting. Associated symptoms comments: When she get the pain she sweats.. The pain is aggravated by walking. She has tried nothing for the symptoms. Risk factors include obesity and lack of exercise.  Her family medical history is significant for diabetes, heart disease, hypertension and stroke.   Sleep Apnea: She presents for follow-up sleep apnea. She complains of none.She reports that she is sleeping better at night with the machine.   She goes to sleep at 10:30 p.m daily.She reports that now with the machine she is only waking up once.  Patient reports that she had her Influenza vaccine 2 weeks ago.     Allergies  Allergen Reactions  . Nickel Rash     Current Outpatient Prescriptions:  .  ketoconazole (NIZORAL) 2 % cream, APPLY 1 APPLICATION TOPICALLY DAILY., Disp: , Rfl: 11 .  meloxicam (MOBIC) 15 MG tablet, TAKE 1 TABLET (15 MG TOTAL) BY MOUTH DAILY. (Patient not taking: Reported on 12/21/2016), Disp: 30 tablet, Rfl: 0 .  naproxen (NAPROSYN) 500 MG  tablet, Take 1 tablet (500 mg total) by mouth 2 (two) times daily with a meal. Reported on 09/22/2015 (Patient not taking: Reported on 12/21/2016), Disp: 60 tablet, Rfl: 3 .  Polyethylene Glycol 3350 (MIRALAX PO), Take by mouth., Disp: , Rfl:   Review of Systems  Constitutional: Positive for activity change, fatigue and malaise/fatigue. Negative for fever.  HENT: Negative.   Eyes: Negative for visual disturbance.  Respiratory: Positive for chest tightness and shortness of breath.   Cardiovascular: Positive for chest pain (under left breast) and palpitations. Negative for leg swelling.  Gastrointestinal: Positive for abdominal distention and nausea. Negative for abdominal pain, blood in stool, constipation, diarrhea and vomiting.  Musculoskeletal: Negative for back pain.  Neurological: Positive for weakness and numbness (on left arm sometimes). Negative for dizziness, light-headedness and headaches.    Social History  Substance Use Topics  . Smoking status: Never Smoker  . Smokeless tobacco: Never Used  . Alcohol use Yes     Comment: Occasional; once a month   Objective:   BP 140/80 (BP Location: Left Arm, Patient Position: Sitting, Cuff Size: Large)   Pulse 73   Temp 98.2 F (36.8 C) (Oral)   Resp 16   Wt 219 lb (99.3 kg)   SpO2 98%   BMI 34.30 kg/m    Physical Exam  Constitutional: She appears well-developed and well-nourished. No distress.  Neck: Normal range of motion. Neck supple. No JVD present. No tracheal deviation present. No thyromegaly present.  Cardiovascular: Normal rate, regular rhythm and normal  heart sounds.  Exam reveals no gallop and no friction rub.   No murmur heard. Pulmonary/Chest: Effort normal and breath sounds normal. No respiratory distress. She has no wheezes. She has no rales. She exhibits tenderness.  Musculoskeletal: She exhibits no edema.  Lymphadenopathy:    She has no cervical adenopathy.  Skin: She is not diaphoretic.  Vitals reviewed.        Assessment & Plan:     1. Chest pain, unspecified type EKG today showed NSR rate of 78 without ST changes personally reviewed by me. Suspect chest pain to be more related to chest wall strain as she has tenderness with palpation to the chest wall and noted tenderness with movement under the left breast and along left side chest wall from breast line to axilla. However, due to patient's family history she would benefit from cardiac evaluation as her mother had a MI following similar symptoms and after normal EKG. Patient concerned and desires consult as well. Referral has been placed as below. Advised patient to call if symptoms worsen.  - EKG 12-Lead - Ambulatory referral to Cardiology  2. OSA on CPAP Stable. Doing well. Still some fatigue but has improved from before starting using CPAP. No longer has daytime somnolence.        Mar Daring, PA-C  [facility]

## 2016-12-22 NOTE — Patient Instructions (Signed)
Chest Wall Pain °Chest wall pain is pain in or around the bones and muscles of your chest. Sometimes, an injury causes this pain. Sometimes, the cause may not be known. This pain may take several weeks or longer to get better. °Follow these instructions at home: °Pay attention to any changes in your symptoms. Take these actions to help with your pain: °· Rest as told by your health care provider. °· Avoid activities that cause pain. These include any activities that use your chest muscles or your abdominal and side muscles to lift heavy items. °· If directed, apply ice to the painful area: °¨ Put ice in a plastic bag. °¨ Place a towel between your skin and the bag. °¨ Leave the ice on for 20 minutes, 2-3 times per day. °· Take over-the-counter and prescription medicines only as told by your health care provider. °· Do not use tobacco products, including cigarettes, chewing tobacco, and e-cigarettes. If you need help quitting, ask your health care provider. °· Keep all follow-up visits as told by your health care provider. This is important. °Contact a health care provider if: °· You have a fever. °· Your chest pain becomes worse. °· You have new symptoms. °Get help right away if: °· You have nausea or vomiting. °· You feel sweaty or light-headed. °· You have a cough with phlegm (sputum) or you cough up blood. °· You develop shortness of breath. °This information is not intended to replace advice given to you by your health care provider. Make sure you discuss any questions you have with your health care provider. °Document Released: 03/12/2005 Document Revised: 07/21/2015 Document Reviewed: 06/07/2014 °Elsevier Interactive Patient Education © 2017 [employer] Inc. ° °

## 2017-02-24 DIAGNOSIS — G4733 Obstructive sleep apnea (adult) (pediatric): Secondary | ICD-10-CM | POA: Insufficient documentation

## 2017-02-24 DIAGNOSIS — R079 Chest pain, unspecified: Secondary | ICD-10-CM | POA: Insufficient documentation

## 2017-02-24 DIAGNOSIS — Z9989 Dependence on other enabling machines and devices: Secondary | ICD-10-CM

## 2017-02-24 NOTE — Progress Notes (Signed)
Cardiology Office Note  Date:  02/26/2017   ID:  SHRIKA MILOS, DOB 1961-02-03, MRN [id number]  PCP:  Mar Daring, PA-C   Chief Complaint  Patient presents with  . other    Chest pain/with exertion, rapid heart beat, unable to exercise due to pain,  sob and edema/swelling. Meds reviewed verbally with pt.    HPI:  Grace [name] is a 56 year old woman with past medical history of Prediabetes , HBA1C 6.1 Hyperlipidemia Positive sleep study wears CPAP, less tired on the CPAP Nonsmoker Who presents by referral from [facility] for consultation of her left side chest pain  She reports that over the past several weeks she has had periodic left chest discomfort Describes the pain is a sharp sticking between her ribs on the left Location is left flank, left of the breast , almost in the axillary area Feels like it hurts inside the ribs, unable to reproduce the pain with palpation or movement Pain does not present with exertion, typically comes on at rest, suddenly "when it hits it feels like an Elephant"  Also reports having other episodes of tachycardia, seems to come and go, no rhyme or reason Sometimes her arms hurt when she is walking  Other issues include leg pain (swelling,left), malaise/fatigue, nausea  Her family medical history is significant for diabetes, heart disease, hypertension and stroke.   Lab work reviewed with her in detail HBA1C 6.1  EKG personally reviewed by myself on todays visit Shows normal sinus rhythm rate 81 bpm no significant ST or T wave changes  Mother with CAD, CABG in 65s to 66 Uncle  with PAD  PMH:   has a past medical history of Anemia, chlamydia infection, Ovarian cyst, and Sleep apnea.  PSH:    Past Surgical History:  Procedure Laterality Date  . ABDOMINAL HYSTERECTOMY  2006   not due to cancer-Partial  . Elbow adjustment Right 03/30/2011   had to "pop" back into place  . TOTAL ABDOMINAL HYSTERECTOMY W/ BILATERAL  SALPINGOOPHORECTOMY  09/07/2013   with Dr. Romelle Starcher    Current Outpatient Medications  Medication Sig Dispense Refill  . ketoconazole (NIZORAL) 2 % cream APPLY 1 APPLICATION TOPICALLY BID.  11  . meloxicam (MOBIC) 15 MG tablet Take 1 tablet (15 mg total) by mouth daily. 30 tablet 5  . naproxen (NAPROSYN) 500 MG tablet Take 1 tablet (500 mg total) by mouth 2 (two) times daily with a meal. Reported on 09/22/2015 60 tablet 3  . Polyethylene Glycol 3350 (MIRALAX PO) Take by mouth.     No current facility-administered medications for this visit.      Allergies:   Nickel   Social History:  The patient  reports that  has never smoked. she has never used smokeless tobacco. She reports that she drinks alcohol. She reports that she does not use drugs.   Family History:   family history includes Asthma in her sister; Diabetes in her mother; Heart disease in her mother; Hypertension in her mother, sister, and sister; Stroke in her mother.    Review of Systems: Review of Systems  Constitutional: Negative.   Respiratory: Negative.   Cardiovascular: Positive for chest pain and palpitations.  Gastrointestinal: Negative.   Musculoskeletal: Negative.   Neurological: Negative.   Psychiatric/Behavioral: Negative.   All other systems reviewed and are negative.    PHYSICAL EXAM: VS:  BP (!) 164/82 (BP Location: Right Arm, Patient Position: Sitting, Cuff Size: Large)   Pulse 81   Ht 5\' 7"  (  1.702 m)   Wt 219 lb 8 oz (99.6 kg)   BMI 34.38 kg/m  , BMI Body mass index is 34.38 kg/m. GEN: Well nourished, well developed, in no acute distress  HEENT: normal  Neck: no JVD, carotid bruits, or masses Cardiac: RRR; no murmurs, rubs, or gallops,no edema  Respiratory:  clear to auscultation bilaterally, normal work of breathing GI: soft, nontender, nondistended, + BS Grace: no deformity or atrophy  Skin: warm and dry, no rash Neuro:  Strength and sensation are intact Psych: euthymic mood, full  affect    Recent Labs: 06/25/2016: ALT 14; BUN 12; Creatinine, Ser 0.76; Hemoglobin 12.0; Platelets 246; Potassium 3.9; Sodium 140; TSH 1.083    Lipid Panel Lab Results  Component Value Date   CHOL 191 06/25/2016   HDL 43 06/25/2016   LDLCALC 131 (H) 06/25/2016   TRIG 85 06/25/2016      Wt Readings from Last 3 Encounters:  02/26/17 219 lb 8 oz (99.6 kg)  12/21/16 219 lb (99.3 kg)  06/22/16 199 lb 6.4 oz (90.4 kg)       ASSESSMENT AND PLAN:  Chest pain, unspecified type - Plan: EKG 12-Lead, CT CARDIAC SCORING Atypical pain, present at rest, sharp in nature, not associated with exertion She does have family history of coronary disease Discussed various imaging modalities for risk stratification Recommended CT coronary calcium scoring for further stratification If score is elevated would need aggressive management of her lipids, possibly additional testing  OSA on CPAP - Plan: EKG 12-Lead Reports her sleeping is better on CPAP Recommended weight loss  Family history of coronary artery disease Will perform a risk stratification study as detailed above given family history Mother in her 53s or 22s who had bypass surgery  Prediabetes Higher numbers likely from recent weight gain  diet has been poor Stressed the importance of low carbohydrate diet  Shortness of breath 20 pound weight gain over the past year, recommended change in diet Dietary guide provided Recommended regular exercise program  We will call her with the results of her CT coronary calcium score Disposition:   F/U as needed   Total encounter time more than 60 minutes  Greater than 50% was spent in counseling and coordination of care with the patient  Patient was seen in consultation for [facility] and will be referred back to her office for ongoing care of the issues detailed above   Orders Placed This Encounter  Procedures  . CT CARDIAC SCORING  . EKG 12-Lead     Signed, Esmond Plants,  M.D., Ph.D. 02/26/2017  [place], [place]

## 2017-02-25 ENCOUNTER — Other Ambulatory Visit: Payer: Self-pay | Admitting: Physician Assistant

## 2017-02-25 DIAGNOSIS — M7582 Other shoulder lesions, left shoulder: Secondary | ICD-10-CM

## 2017-02-25 MED ORDER — MELOXICAM 15 MG PO TABS: 15.0000 mg | ORAL_TABLET | Freq: Every day | ORAL | 5 refills | Status: DC

## 2017-02-25 NOTE — Addendum Note (Signed)
Addended by: Mar Daring on: 02/25/2017 03:24 PM   Modules accepted: Orders

## 2017-02-26 ENCOUNTER — Encounter: Payer: Self-pay | Admitting: Cardiovascular Disease

## 2017-02-26 ENCOUNTER — Ambulatory Visit: Payer: BLUE CROSS/BLUE SHIELD | Admitting: Cardiovascular Disease

## 2017-02-26 DIAGNOSIS — G4733 Obstructive sleep apnea (adult) (pediatric): Secondary | ICD-10-CM | POA: Diagnosis not present

## 2017-02-26 DIAGNOSIS — Z9989 Dependence on other enabling machines and devices: Secondary | ICD-10-CM | POA: Diagnosis not present

## 2017-02-26 DIAGNOSIS — R0602 Shortness of breath: Secondary | ICD-10-CM | POA: Diagnosis not present

## 2017-02-26 DIAGNOSIS — Z8249 Family history of ischemic heart disease and other diseases of the circulatory system: Secondary | ICD-10-CM | POA: Diagnosis not present

## 2017-02-26 DIAGNOSIS — R7303 Prediabetes: Secondary | ICD-10-CM | POA: Insufficient documentation

## 2017-02-26 DIAGNOSIS — R079 Chest pain, unspecified: Secondary | ICD-10-CM

## 2017-02-26 DIAGNOSIS — R0609 Other forms of dyspnea: Secondary | ICD-10-CM | POA: Insufficient documentation

## 2017-02-26 NOTE — Patient Instructions (Addendum)
Medication Instructions:   No medication changes made  Labwork:  No new labs needed  Testing/Procedures:  We will schedule you for a CT coronary caclium score For chest pain, family hx of CAD   Follow-Up: It was a pleasure seeing you in the office today. Please call us if you have new issues that need to be addressed before your next appt.  [phone]  Your physician wants you to follow-up in: As needed   If you need a refill on your cardiac medications before your next appointment, please call your pharmacy.

## 2017-07-05 ENCOUNTER — Encounter: Payer: BLUE CROSS/BLUE SHIELD | Admitting: Physician Assistant

## 2017-08-16 ENCOUNTER — Encounter: Payer: Self-pay | Admitting: Physician Assistant

## 2017-08-27 ENCOUNTER — Other Ambulatory Visit: Payer: Self-pay | Admitting: Physician Assistant

## 2017-08-27 DIAGNOSIS — G8929 Other chronic pain: Secondary | ICD-10-CM

## 2017-08-27 DIAGNOSIS — M545 Low back pain: Principal | ICD-10-CM

## 2017-08-27 MED ORDER — NAPROXEN 500 MG PO TABS: ORAL_TABLET | ORAL | 5 refills | Status: DC

## 2017-08-27 NOTE — Addendum Note (Signed)
Addended by: Mar Daring on: 08/27/2017 08:49 PM   Modules accepted: Orders

## 2018-03-05 IMAGING — MG MM DIGITAL SCREENING BILAT W/ TOMO W/ CAD
8 of 13 series · 8 of 29 positions shown · non-contrast
Comparison: Previous exam(s).

CLINICAL DATA: Screening.

EXAM:
2D DIGITAL SCREENING BILATERAL MAMMOGRAM WITH CAD AND ADJUNCT TOMO

[R CC (1 of 2)]
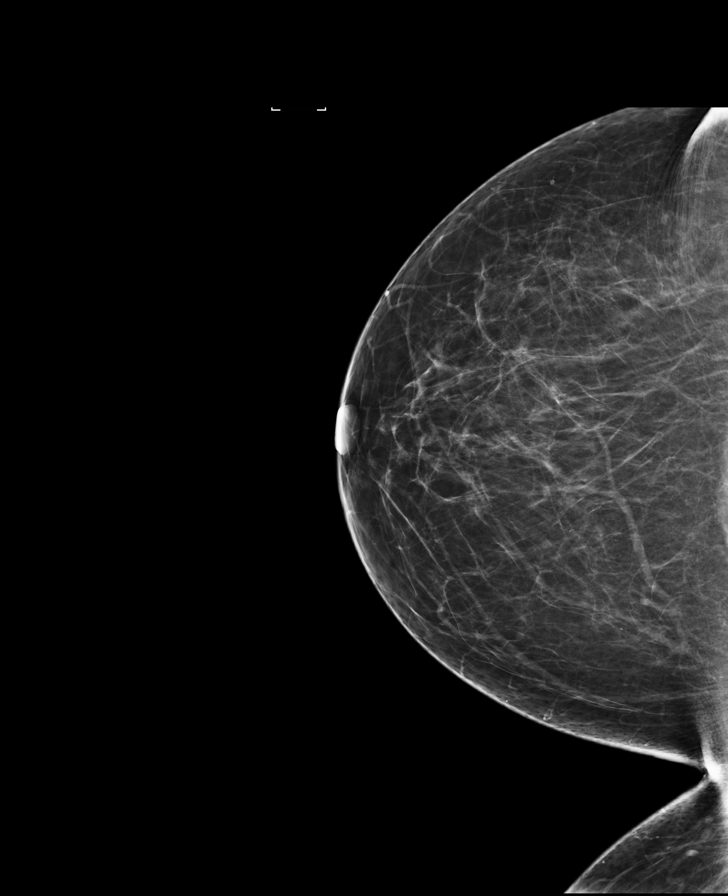

[L CC]
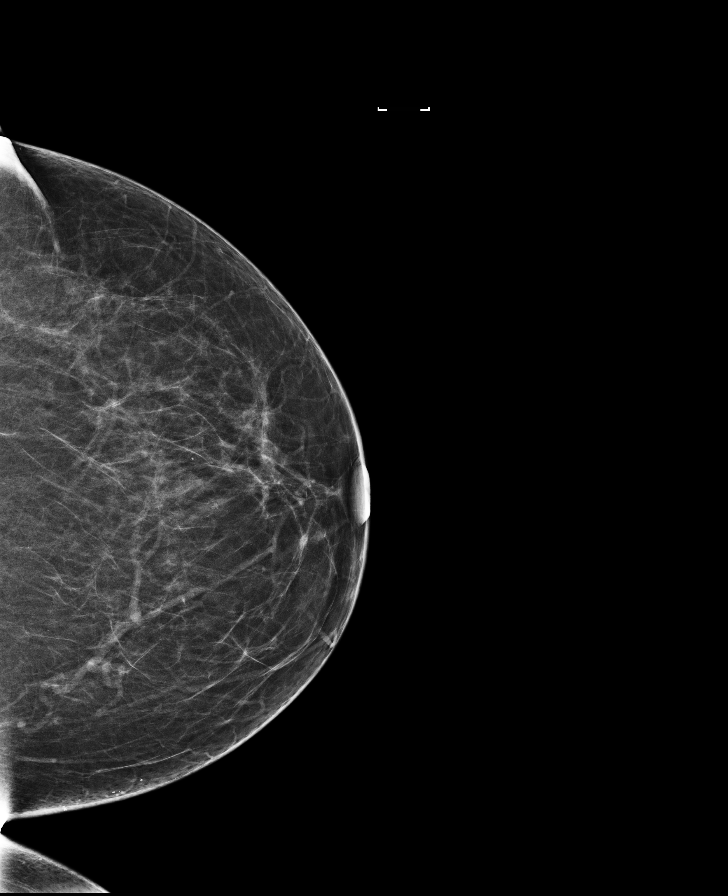

[R CC (2 of 2)]
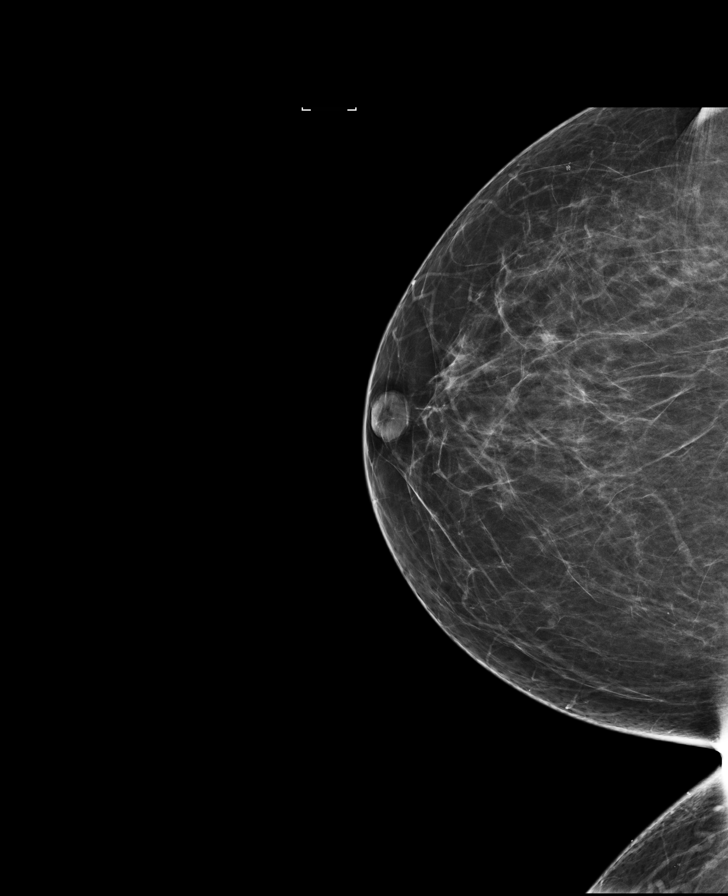

[L MLO synth-2D]
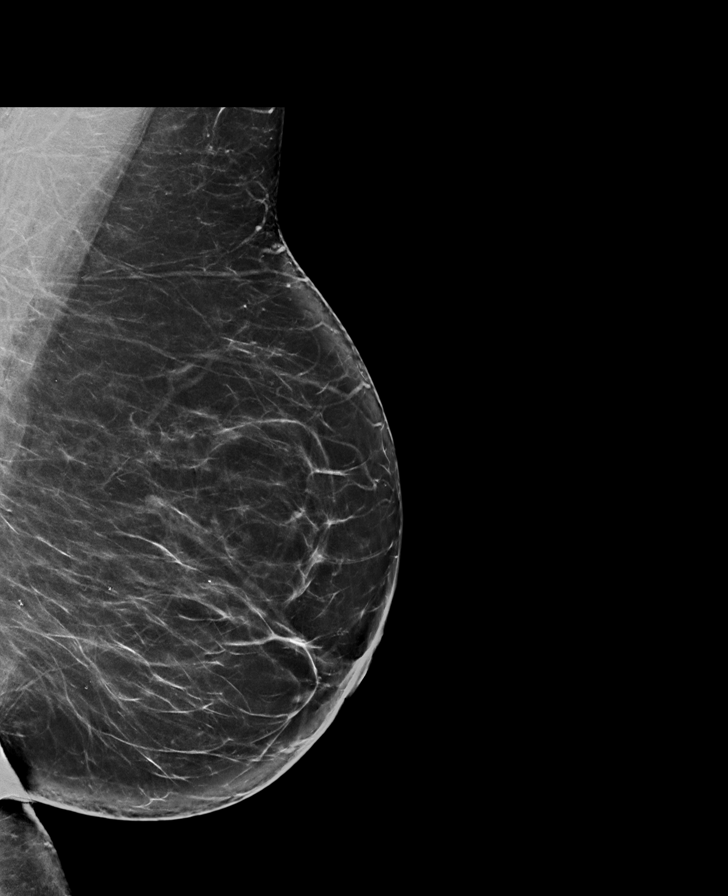

[L MLO]
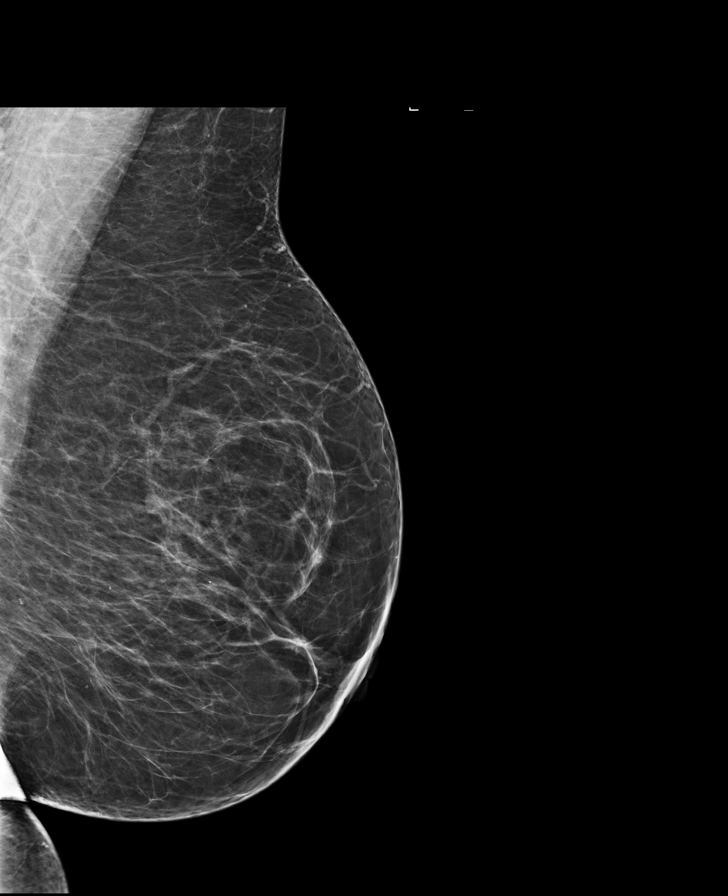

[R CC synth-2D]
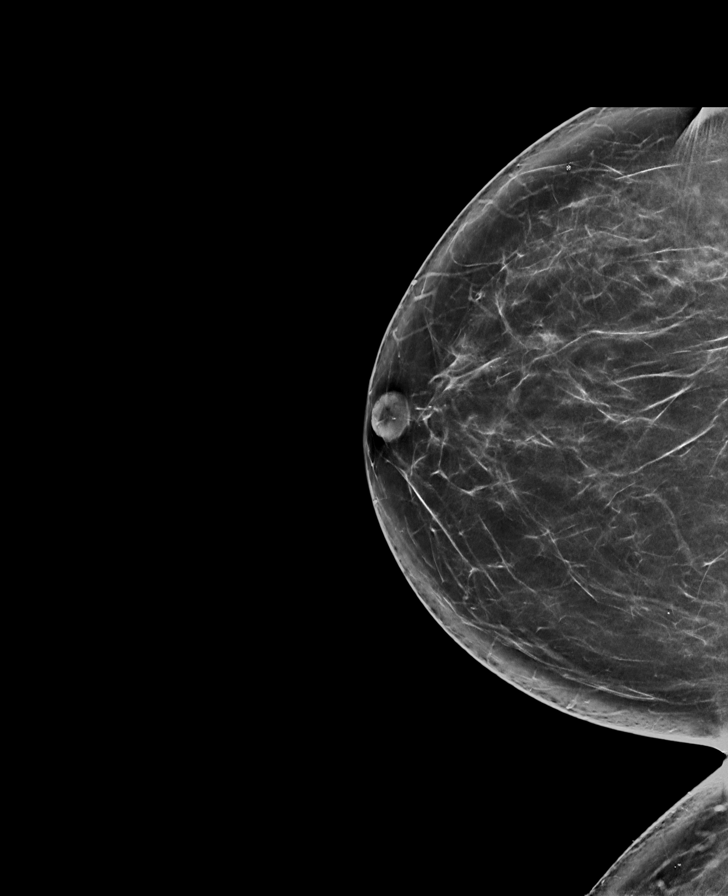

[R MLO synth-2D]
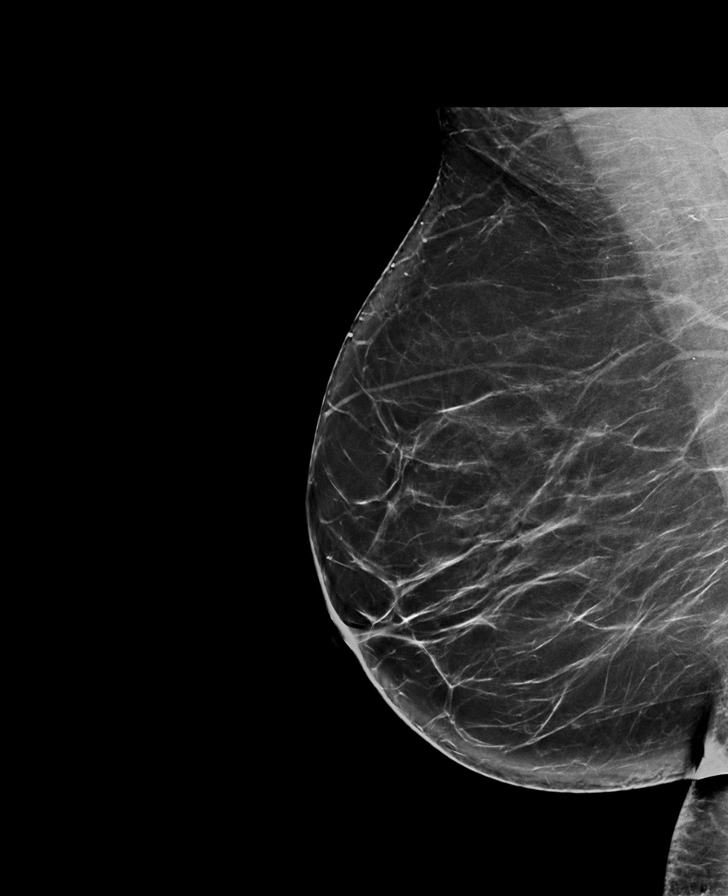

[L CC synth-2D]
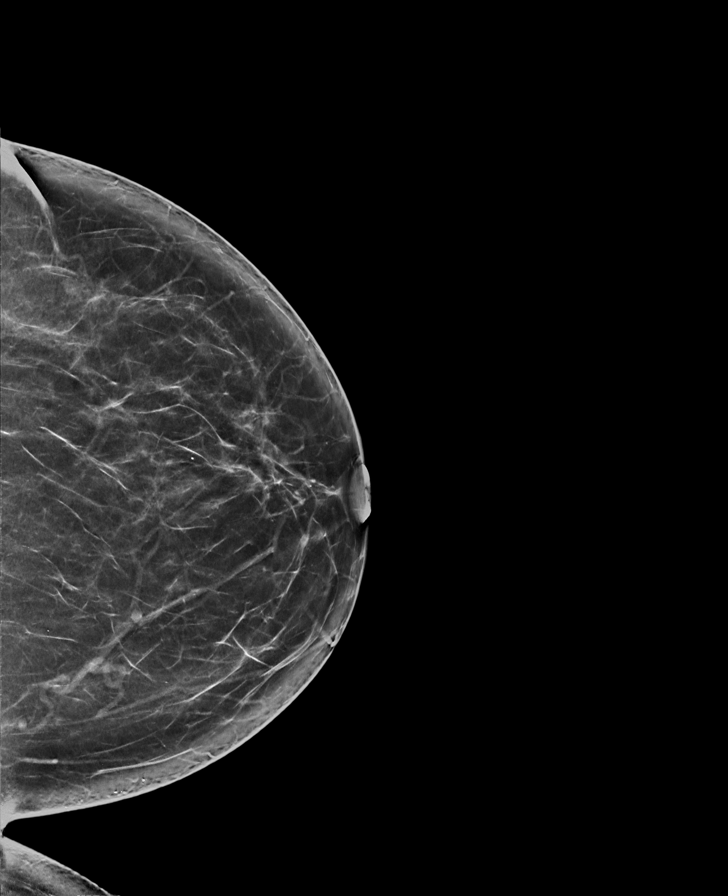

[8 of 29 positions shown; findings below may reference images not displayed]

ACR Breast Density Category b: There are scattered areas of
fibroglandular density.
FINDINGS: There are no findings suspicious for malignancy. Images were
processed with CAD.
IMPRESSION: No mammographic evidence of malignancy. A result letter of this
screening mammogram will be mailed directly to the patient.

RECOMMENDATION:
Screening mammogram in one year. (Code:97-6-RS4)

BI-RADS CATEGORY  1: Negative.

## 2018-03-26 IMAGING — CR DG TIBIA/FIBULA 2V*L*
1 series · 4 of 4 positions shown · non-contrast
Comparison: None.

CLINICAL DATA: Left lower extremity pain after fall months ago.

EXAM:
LEFT TIBIA AND FIBULA - 2 VIEW

[Series 1: dg tibia/fibula left · 0.14mm/px · 4 of 4 slices shown]
[im 1/4]
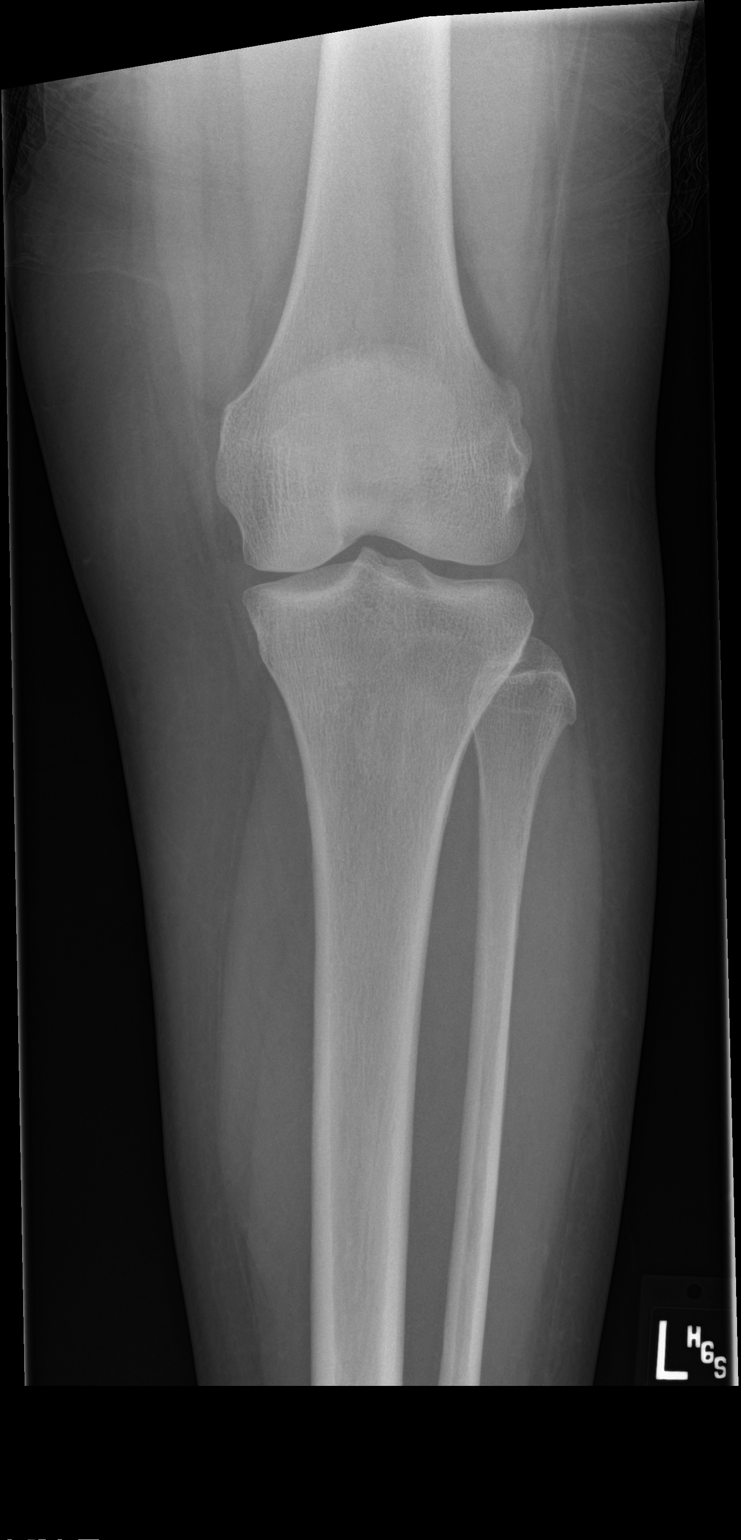
[im 2/4]
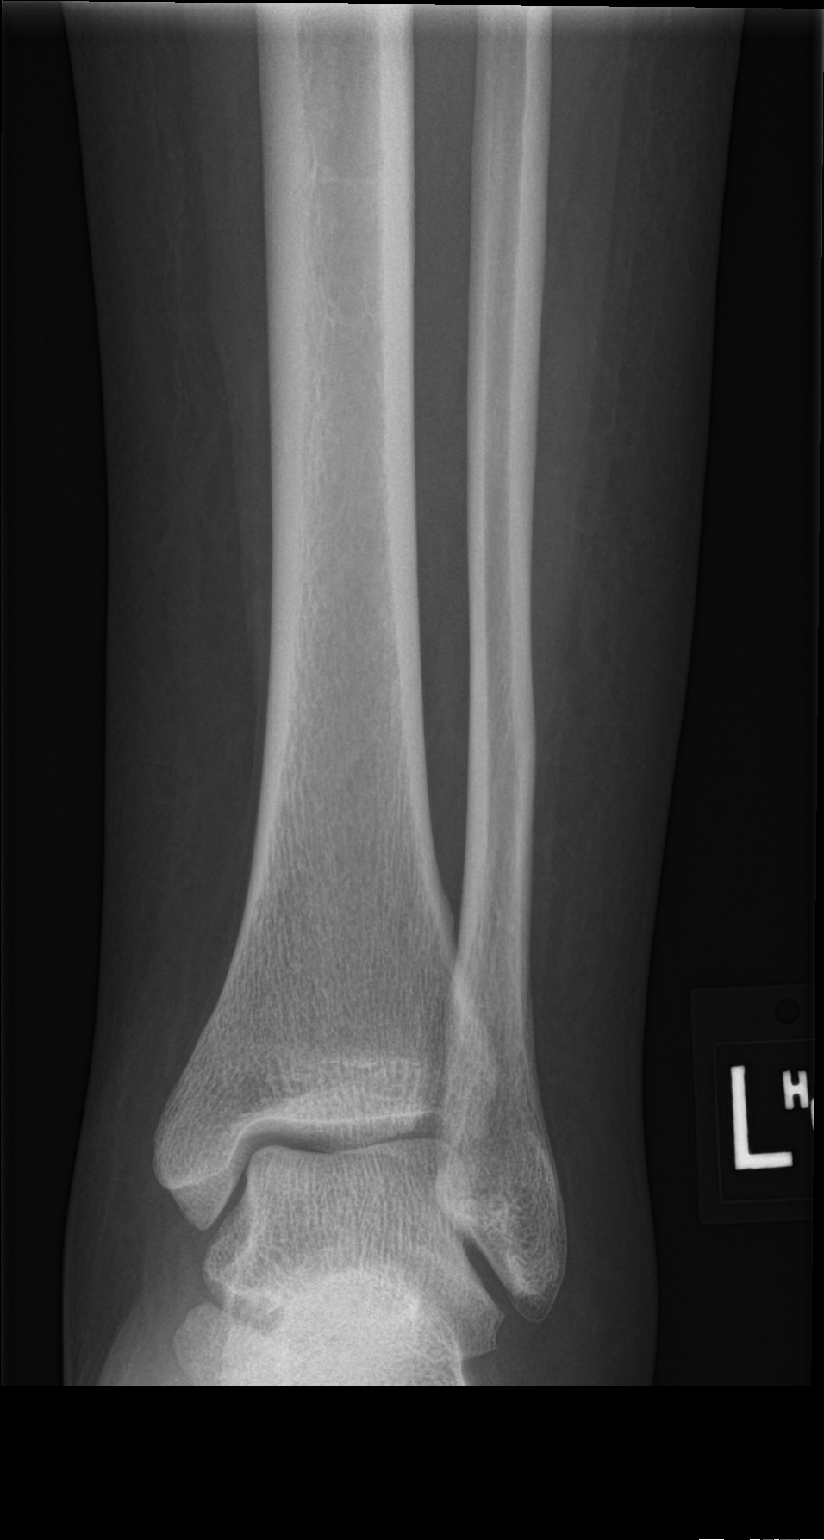
[im 3/4]
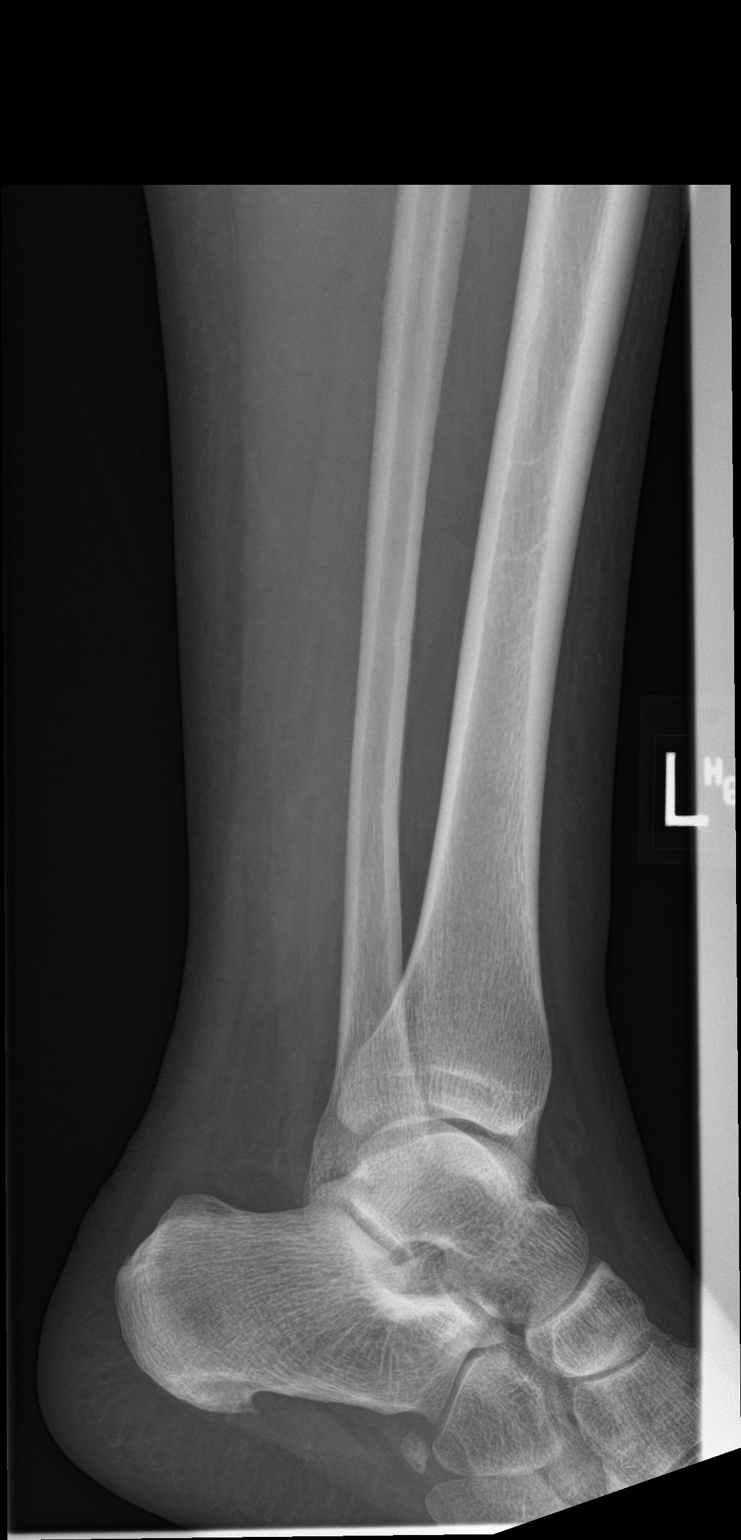
[im 4/4]
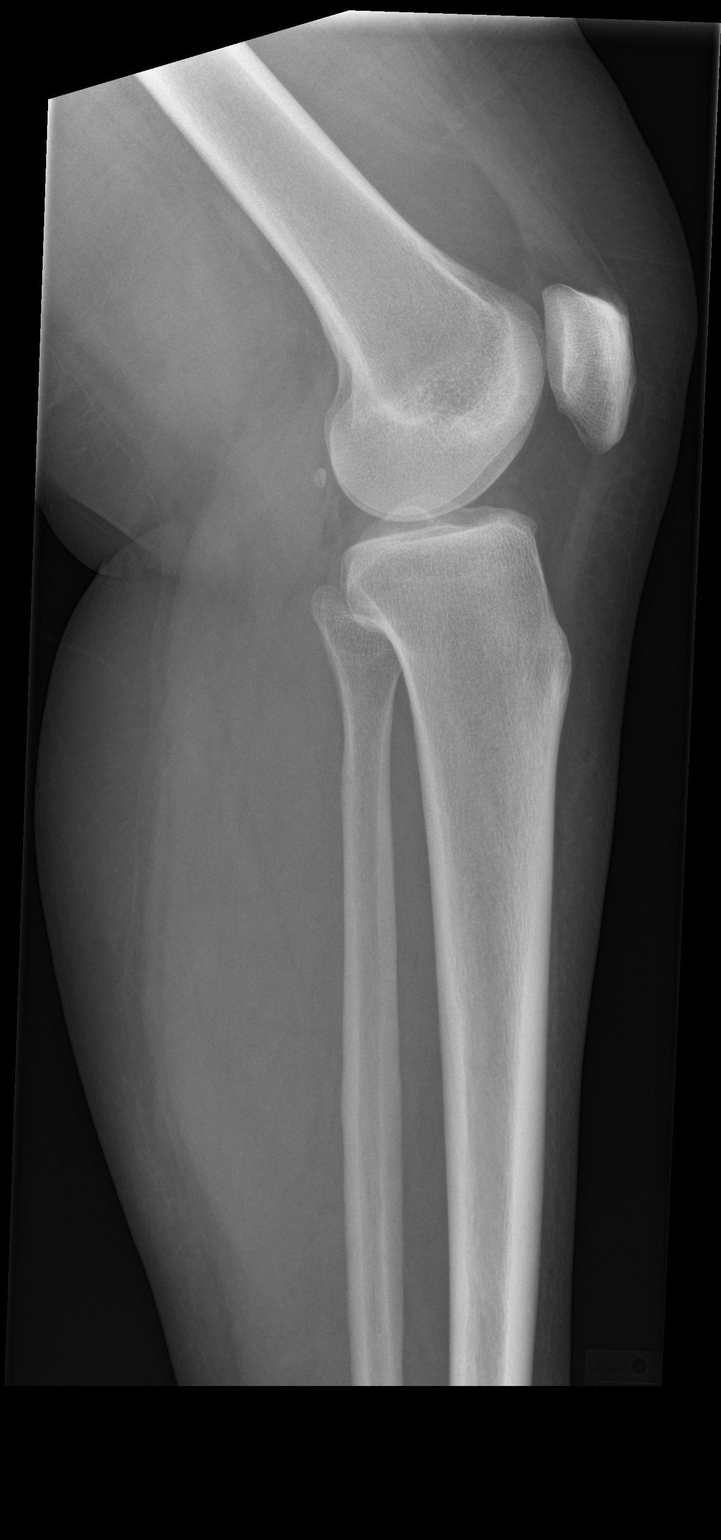

[4 of 4 positions shown; findings below may reference images not displayed]

FINDINGS: There is no evidence of fracture or other focal bone lesions. Soft
tissues are unremarkable.
IMPRESSION: Normal left tibia and fibula.

## 2018-04-09 MED ORDER — Medication
1.00 | Status: DC
Start: 2018-04-10 — End: 2018-04-09

## 2018-04-09 MED ORDER — ACCU-PRO PUMP SET/VENT MISC
2.50 | Status: DC
Start: ? — End: 2018-04-09

## 2018-04-09 MED ORDER — DOXYCYCLINE HYCLATE 100 MG PO TABS
100.00 | ORAL_TABLET | ORAL | Status: DC
Start: 2018-04-09 — End: 2018-04-09

## 2018-04-09 MED ORDER — VICON FORTE PO CAPS
17.00 | ORAL_CAPSULE | ORAL | Status: DC
Start: ? — End: 2018-04-09

## 2018-04-09 MED ORDER — BARO-CAT PO
75.00 | ORAL | Status: DC
Start: ? — End: 2018-04-09

## 2018-04-09 MED ORDER — Medication
Status: DC
Start: ? — End: 2018-04-09

## 2018-04-09 MED ORDER — SELECT BRAND INSULIN SYRINGE 29G X 1/2" 1 ML MISC
1.00 | Status: DC
Start: 2018-04-10 — End: 2018-04-09

## 2018-04-09 MED ORDER — ACETAMINOPHEN 325 MG PO TABS
650.00 | ORAL_TABLET | ORAL | Status: DC
Start: ? — End: 2018-04-09

## 2018-04-09 MED ORDER — OATMEAL BATH OILATED EX PACK
200.00 | PACK | CUTANEOUS | Status: DC
Start: ? — End: 2018-04-09

## 2018-04-09 MED ORDER — OLANZAPINE-FLUOXETINE HCL 6-50 MG PO CAPS
3.00 | ORAL_CAPSULE | ORAL | Status: DC
Start: ? — End: 2018-04-09

## 2018-04-09 MED ORDER — CVS CALCIUM CARBONATE PO
400.00 | ORAL | Status: DC
Start: ? — End: 2018-04-09

## 2019-04-28 ENCOUNTER — Other Ambulatory Visit: Payer: Self-pay | Admitting: Physician Assistant

## 2019-07-10 NOTE — Progress Notes (Signed)
Complete physical exam    Patient: Grace [name]   DOB: 1960/08/15   59 y.o. Female  MRN: [id number] Visit Date: 07/13/2019  Today's healthcare provider: Mar Daring, PA-C  Subjective:    Chief Complaint  Patient presents with  . Annual Exam    Grace [name] is a 59 y.o. female who presents today for a complete physical exam.  She reports consuming a general diet. The patient does not participate in regular exercise at present. She generally feels well. She reports sleeping well. She does not have additional problems to discuss today.  HPI  Patient is re-establishing today. Over the last 2 years she was going to [facility] as she did not have insurance.  Reports that she was diagnosed with high cholesterol and Hypertension and prediabetes. She was placed on medicine for this. She also reports that she was prescribed a fluid pill, but it was stopped by Dr. Glory Rosebush at [facility] [place]. She also was seen by Dr. Rockney Ghee Cardiologist because her blood pressure was high.  Past Medical History:  Diagnosis Date  . Anemia   . Hx of chlamydia infection   . Ovarian cyst   . Sleep apnea    Past Surgical History:  Procedure Laterality Date  . ABDOMINAL HYSTERECTOMY  2006   not due to cancer-Partial  . Elbow adjustment Right 03/30/2011   had to "pop" back into place  . TOTAL ABDOMINAL HYSTERECTOMY W/ BILATERAL SALPINGOOPHORECTOMY  09/07/2013   with Dr. Romelle Starcher   Social History   Socioeconomic History  . Marital status: Married    Spouse name: Shonna Chock  . Number of children: Not on file  . Years of education: Not on file  . Highest education level: Not on file  Occupational History  . Not on file  Tobacco Use  . Smoking status: Never Smoker  . Smokeless tobacco: Never Used  Substance and Sexual Activity  . Alcohol use: Yes    Comment: Occasional; once a month  . Drug use: No  . Sexual activity: Not on file  Other Topics Concern  . Not on  file  Social History Narrative  . Not on file   Social Determinants of Health   Financial Resource Strain:   . Difficulty of Paying Living Expenses:   Food Insecurity:   . Worried About Charity fundraiser in the Last Year:   . Arboriculturist in the Last Year:   Transportation Needs:   . Film/video editor (Medical):   Marland Kitchen Lack of Transportation (Non-Medical):   Physical Activity:   . Days of Exercise per Week:   . Minutes of Exercise per Session:   Stress:   . Feeling of Stress :   Social Connections:   . Frequency of Communication with Friends and Family:   . Frequency of Social Gatherings with Friends and Family:   . Attends Religious Services:   . Active Member of Clubs or Organizations:   . Attends Archivist Meetings:   Marland Kitchen Marital Status:   Intimate Partner Violence:   . Fear of Current or Ex-Partner:   . Emotionally Abused:   Marland Kitchen Physically Abused:   . Sexually Abused:    Family Status  Relation Name Status  . Mother  Deceased       Hx of stroke, high blood pressure,and diabetes  . Father  Deceased       cause of death was bone cancer  . Sister  1 Alive  . Sister 2 Alive  . Sister 3 Alive  . Neg Hx  (Not Specified)   Family History  Problem Relation Age of Onset  . Diabetes Mother   . Hypertension Mother   . Stroke Mother   . Heart disease Mother   . Asthma Sister   . Hypertension Sister   . Hypertension Sister   . Breast cancer Neg Hx    Allergies  Allergen Reactions  . Nickel Rash    Patient Care Team: Mar Daring, PA-C as PCP - General (Family Medicine)   Medications: Outpatient Medications Prior to Visit  Medication Sig  . ascorbic acid (VITAMIN C) 500 MG tablet Take by mouth.  Marland Kitchen atorvastatin (LIPITOR) 40 MG tablet Take by mouth.  . Cholecalciferol 25 MCG (1000 UT) capsule Take by mouth.  Marland Kitchen ibuprofen (ADVIL) 200 MG tablet Take by mouth.  Marland Kitchen lisinopril (ZESTRIL) 10 MG tablet Take by mouth.  . metFORMIN (GLUCOPHAGE) 500 MG  tablet Take by mouth.  . senna-docusate (SENOKOT-S) 8.6-50 MG tablet Take by mouth.  Marland Kitchen ketoconazole (NIZORAL) 2 % cream APPLY 1 APPLICATION TOPICALLY BID.  Marland Kitchen meloxicam (MOBIC) 15 MG tablet Take 1 tablet (15 mg total) by mouth daily.  . naproxen (NAPROSYN) 500 MG tablet TAKE 1 TABLET (500 MG TOTAL) BY MOUTH 2 (TWO) TIMES DAILY WITH A MEAL.  . Polyethylene Glycol 3350 (MIRALAX PO) Take by mouth.   No facility-administered medications prior to visit.    Review of Systems  Constitutional: Negative.   HENT: Negative.   Eyes: Negative.   Respiratory: Negative.   Cardiovascular: Negative.   Gastrointestinal: Negative.   Endocrine: Negative.   Genitourinary: Positive for vaginal discharge.  Musculoskeletal: Negative.   Skin: Negative.   Allergic/Immunologic: Negative.   Neurological: Negative.   Hematological: Negative.   Psychiatric/Behavioral: Negative.     Last CBC Lab Results  Component Value Date   WBC 4.5 06/25/2016   HGB 12.0 06/25/2016   HCT 36.3 06/25/2016   MCV 80.8 06/25/2016   MCH 26.8 06/25/2016   RDW 13.6 06/25/2016   PLT 246 [id number]   Last metabolic panel Lab Results  Component Value Date   GLUCOSE 99 06/25/2016   NA 140 06/25/2016   K 3.9 06/25/2016   CL 109 06/25/2016   CO2 27 06/25/2016   BUN 12 06/25/2016   CREATININE 0.76 06/25/2016   GFRNONAA >60 06/25/2016   GFRAA >60 06/25/2016   CALCIUM 9.4 06/25/2016   PROT 7.4 06/25/2016   ALBUMIN 4.0 06/25/2016   BILITOT 0.3 06/25/2016   ALKPHOS 70 06/25/2016   AST 16 06/25/2016   ALT 14 06/25/2016   ANIONGAP 4 (L) 06/25/2016   Last hemoglobin A1c Lab Results  Component Value Date   HGBA1C 6.1 (H) 06/25/2016        Objective:    BP (!) 153/82 (BP Location: Left Arm, Patient Position: Sitting, Cuff Size: Large)   Pulse 76   Temp (!) 97.3 F (36.3 C) (Temporal)   Resp 16   Ht 5\' 7"  (1.702 m)   Wt 229 lb 3.2 oz (104 kg)   BMI 35.90 kg/m  BP Readings from Last 3 Encounters:  07/13/19 (!)  153/82  02/26/17 (!) 164/82  12/21/16 140/80   Wt Readings from Last 3 Encounters:  07/13/19 229 lb 3.2 oz (104 kg)  02/26/17 219 lb 8 oz (99.6 kg)  12/21/16 219 lb (99.3 kg)      Physical Exam Vitals reviewed.  Constitutional:  General: She is not in acute distress.    Appearance: Normal appearance. She is well-developed. She is obese. She is not ill-appearing or diaphoretic.  HENT:     Head: Normocephalic and atraumatic.     Right Ear: Hearing, tympanic membrane, ear canal and external ear normal.     Left Ear: Hearing, tympanic membrane, ear canal and external ear normal.     Nose: Nose normal.     Mouth/Throat:     Mouth: Mucous membranes are moist.     Pharynx: Oropharynx is clear. Uvula midline. No oropharyngeal exudate or posterior oropharyngeal erythema.  Eyes:     General: No scleral icterus.       Right eye: No discharge.        Left eye: No discharge.     Extraocular Movements: Extraocular movements intact.     Conjunctiva/sclera: Conjunctivae normal.     Pupils: Pupils are equal, round, and reactive to light.  Neck:     Thyroid: No thyromegaly.     Vascular: No carotid bruit or JVD.     Trachea: No tracheal deviation.  Cardiovascular:     Rate and Rhythm: Normal rate and regular rhythm.     Pulses: Normal pulses.     Heart sounds: Normal heart sounds. No murmur. No friction rub. No gallop.   Pulmonary:     Effort: Pulmonary effort is normal. No respiratory distress.     Breath sounds: Normal breath sounds. No wheezing or rales.  Chest:     Chest wall: No tenderness.     Breasts: Breasts are symmetrical.        Right: No inverted nipple, mass, nipple discharge, skin change or tenderness.        Left: No inverted nipple, mass, nipple discharge, skin change or tenderness.  Abdominal:     General: Abdomen is flat. Bowel sounds are normal. There is no distension.     Palpations: Abdomen is soft. There is no mass.     Tenderness: There is no abdominal  tenderness. There is no guarding or rebound.     Hernia: There is no hernia in the left inguinal area or right inguinal area.  Genitourinary:    General: Normal vulva.     Exam position: Supine.     Labia:        Right: No rash, tenderness, lesion or injury.        Left: No rash, tenderness, lesion or injury.      Vagina: No signs of injury. Vaginal discharge (minimal white, mucous discharge) present. No erythema, tenderness or bleeding.     Uterus: Absent.      Adnexa: Right adnexa normal and left adnexa normal.       Right: No mass, tenderness or fullness.         Left: No mass, tenderness or fullness.       Rectum: Normal.  Musculoskeletal:        General: No tenderness. Normal range of motion.     Cervical back: Normal range of motion and neck supple.     Right lower leg: No edema.     Left lower leg: No edema.  Lymphadenopathy:     Cervical: No cervical adenopathy.     Lower Body: No right inguinal adenopathy. No left inguinal adenopathy.  Skin:    General: Skin is warm and dry.     Capillary Refill: Capillary refill takes less than 2 seconds.     Findings: No rash.  Neurological:     General: No focal deficit present.     Mental Status: She is alert and oriented to person, place, and time. Mental status is at baseline.     Cranial Nerves: No cranial nerve deficit.     Coordination: Coordination normal.     Deep Tendon Reflexes: Reflexes are normal and symmetric.  Psychiatric:        Mood and Affect: Mood normal.        Behavior: Behavior normal.        Thought Content: Thought content normal.        Judgment: Judgment normal.      Depression Screen  PHQ 2/9 Scores 07/13/2019 06/22/2016  PHQ - 2 Score 0 0  PHQ- 9 Score - 2    No results found for any visits on 07/13/19.    Assessment & Plan:    Routine Health Maintenance and Physical Exam  Exercise Activities and Dietary recommendations Goals   None     Immunization History  Administered Date(s)  Administered  . Influenza,inj,Quad PF,6+ Mos 04/09/2018  . Tdap 07/30/2012    Health Maintenance  Topic Date Due  . HIV Screening  Never done  . COVID-19 Vaccine (1) Never done  . COLONOSCOPY  Never done  . MAMMOGRAM  11/03/2018  . PAP SMEAR-Modifier  06/23/2019  . INFLUENZA VACCINE  10/25/2019  . TETANUS/TDAP  07/31/2022  . Hepatitis C Screening  Completed    Discussed health benefits of physical activity, and encouraged her to engage in regular exercise appropriate for her age and condition.  1. Annual physical exam Normal physical exam today. Will check labs as below and f/u pending lab results. If labs are stable and WNL she will not need to have these rechecked for one year at her next annual physical exam. She is to call the office in the meantime if she has any acute issue, questions or concerns.  2. Encounter for screening mammogram for breast cancer Breast exam today was normal. There is no family history of breast cancer. She does perform regular self breast exams. Mammogram was ordered as below. Information for [facility] was given to patient so she may schedule her mammogram at her convenience. - MM 3D SCREEN BREAST BILATERAL; Future  3. Screening for colon cancer Due for repeat colonoscopy. Referral placed.  - Ambulatory referral to Gastroenterology  4. Family history of coronary artery disease Will check labs. Continue f/u with cardiologist.  5. Prediabetes Diet controlled. Will check labs as below and f/u pending results. - CBC with Differential/Platelet - Comprehensive metabolic panel - Hemoglobin A1c  6. Vaginal discharge Vaginal swab collected. Will check as below and f/u pending results. - Cervicovaginal ancillary only  7. Class 2 severe obesity due to excess calories with serious comorbidity and body mass index (BMI) of 35.0 to 35.9 in adult [facility]) Counseled patient on healthy lifestyle modifications including dieting and exercise.  - CBC  with Differential/Platelet - Comprehensive metabolic panel  8. Lower extremity edema Will restart HCTZ. Continue Lisinopril. Will check labs as below and f/u pending results. - Comprehensive metabolic panel - hydrochlorothiazide (HYDRODIURIL) 25 MG tablet; Take 1 tablet (25 mg total) by mouth daily.  Dispense: 90 tablet; Refill: 1  9. Pure hypercholesterolemia Continue Atorvastatin 40mg .  Will check labs as below and f/u pending results. - CBC with Differential/Platelet - Comprehensive metabolic panel - Lipid panel  10. Screening for HIV without presence of risk factors Will check labs as below and f/u  pending results. - HIV Antibody (routine testing w rflx)  11. Family history of colon cancer Paternal uncles.  - Ambulatory referral to Gastroenterology  12. Family history of breast cancer Maternal aunts.  - MM 3D SCREEN BREAST BILATERAL; Future  13. Gastroesophageal reflux disease without esophagitis Stable. Diagnosis pulled for medication refill. Continue current medical treatment plan. - omeprazole (PRILOSEC) 40 MG capsule; Take 1 capsule (40 mg total) by mouth daily.  Dispense: 90 capsule; Refill: 1   No follow-ups on file.     Reynolds Bowl, PA-C, have reviewed all documentation for this visit. The documentation on 07/15/19 for the exam, diagnosis, procedures, and orders are all accurate and complete.   Rubye Beach  [facility] [phone] (phone) [phone] (fax)  [place]

## 2019-07-13 ENCOUNTER — Ambulatory Visit (INDEPENDENT_AMBULATORY_CARE_PROVIDER_SITE_OTHER): Payer: Managed Care, Other (non HMO) | Admitting: Physician Assistant

## 2019-07-13 ENCOUNTER — Other Ambulatory Visit: Payer: Self-pay

## 2019-07-13 ENCOUNTER — Other Ambulatory Visit (HOSPITAL_COMMUNITY)
Admission: RE | Admit: 2019-07-13 | Discharge: 2019-07-13 | Disposition: A | Discharge status: Home / Self Care | Payer: Managed Care, Other (non HMO) | Source: Ambulatory Visit | Attending: Physician Assistant | Admitting: Physician Assistant

## 2019-07-13 ENCOUNTER — Encounter: Payer: Self-pay | Admitting: Physician Assistant

## 2019-07-13 VITALS — BP 153/82 | HR 76 | Temp 97.3°F | Resp 16 | Ht 67.0 in | Wt 229.2 lb

## 2019-07-13 DIAGNOSIS — N898 Other specified noninflammatory disorders of vagina: Secondary | ICD-10-CM | POA: Insufficient documentation

## 2019-07-13 DIAGNOSIS — Z8249 Family history of ischemic heart disease and other diseases of the circulatory system: Secondary | ICD-10-CM

## 2019-07-13 DIAGNOSIS — Z803 Family history of malignant neoplasm of breast: Secondary | ICD-10-CM

## 2019-07-13 DIAGNOSIS — K219 Gastro-esophageal reflux disease without esophagitis: Secondary | ICD-10-CM | POA: Diagnosis not present

## 2019-07-13 DIAGNOSIS — Z6835 Body mass index (BMI) 35.0-35.9, adult: Secondary | ICD-10-CM

## 2019-07-13 DIAGNOSIS — Z1231 Encounter for screening mammogram for malignant neoplasm of breast: Secondary | ICD-10-CM | POA: Diagnosis not present

## 2019-07-13 DIAGNOSIS — Z1211 Encounter for screening for malignant neoplasm of colon: Secondary | ICD-10-CM | POA: Diagnosis not present

## 2019-07-13 DIAGNOSIS — R600 Localized edema: Secondary | ICD-10-CM

## 2019-07-13 DIAGNOSIS — Z0000 Encounter for general adult medical examination without abnormal findings: Secondary | ICD-10-CM

## 2019-07-13 DIAGNOSIS — Z114 Encounter for screening for human immunodeficiency virus [HIV]: Secondary | ICD-10-CM

## 2019-07-13 DIAGNOSIS — Z800 Family history of malignant neoplasm of digestive organs: Secondary | ICD-10-CM

## 2019-07-13 DIAGNOSIS — E7800 Pure hypercholesterolemia, unspecified: Secondary | ICD-10-CM

## 2019-07-13 DIAGNOSIS — R7303 Prediabetes: Secondary | ICD-10-CM

## 2019-07-13 MED ORDER — HYDROCHLOROTHIAZIDE 25 MG PO TABS: 25.0000 mg | ORAL_TABLET | Freq: Every day | ORAL | 1 refills | Status: DC

## 2019-07-13 MED ORDER — OMEPRAZOLE 40 MG PO CPDR: 40.0000 mg | DELAYED_RELEASE_CAPSULE | Freq: Every day | ORAL | 1 refills | Status: DC

## 2019-07-13 NOTE — Patient Instructions (Signed)
[facility] [address] [place],  [place]  [address] Get Driving Directions Main: [phone]   Health Maintenance for Postmenopausal Women Menopause is a normal process in which your ability to get pregnant comes to an end. This process happens slowly over many months or years, usually between the ages of 48 and 55. Menopause is complete when you have missed your menstrual periods for 12 months. It is important to talk with your health care provider about some of the most common conditions that affect women after menopause (postmenopausal women). These include heart disease, cancer, and bone loss (osteoporosis). Adopting a healthy lifestyle and getting preventive care can help to promote your health and wellness. The actions you take can also lower your chances of developing some of these common conditions. What should I know about menopause? During menopause, you may get a number of symptoms, such as:  Hot flashes. These can be moderate or severe.  Night sweats.  Decrease in sex drive.  Mood swings.  Headaches.  Tiredness.  Irritability.  Memory problems.  Insomnia. Choosing to treat or not to treat these symptoms is a decision that you make with your health care provider. Do I need hormone replacement therapy?  Hormone replacement therapy is effective in treating symptoms that are caused by menopause, such as hot flashes and night sweats.  Hormone replacement carries certain risks, especially as you become older. If you are thinking about using estrogen or estrogen with progestin, discuss the benefits and risks with your health care provider. What is my risk for heart disease and stroke? The risk of heart disease, heart attack, and stroke increases as you age. One of the causes may be a change in the body's hormones during menopause. This can affect how your body uses dietary fats, triglycerides, and cholesterol. Heart attack and stroke  are medical emergencies. There are many things that you can do to help prevent heart disease and stroke. Watch your blood pressure  High blood pressure causes heart disease and increases the risk of stroke. This is more likely to develop in people who have high blood pressure readings, are of African descent, or are overweight.  Have your blood pressure checked: ? Every 3-5 years if you are 18-39 years of age. ? Every year if you are 40 years old or older. Eat a healthy diet   Eat a diet that includes plenty of vegetables, fruits, low-fat dairy products, and lean protein.  Do not eat a lot of foods that are high in solid fats, added sugars, or sodium. Get regular exercise Get regular exercise. This is one of the most important things you can do for your health. Most adults should:  Try to exercise for at least 150 minutes each week. The exercise should increase your heart rate and make you sweat (moderate-intensity exercise).  Try to do strengthening exercises at least twice each week. Do these in addition to the moderate-intensity exercise.  Spend less time sitting. Even light physical activity can be beneficial. Other tips  Work with your health care provider to achieve or maintain a healthy weight.  Do not use any products that contain nicotine or tobacco, such as cigarettes, e-cigarettes, and chewing tobacco. If you need help quitting, ask your health care provider.  Know your numbers. Ask your health care provider to check your cholesterol and your blood sugar (glucose). Continue to have your blood tested as directed by your health care provider. Do I need screening for   cancer? Depending on your health history and family history, you may need to have cancer screening at different stages of your life. This may include screening for:  Breast cancer.  Cervical cancer.  Lung cancer.  Colorectal cancer. What is my risk for osteoporosis? After menopause, you may be at increased  risk for osteoporosis. Osteoporosis is a condition in which bone destruction happens more quickly than new bone creation. To help prevent osteoporosis or the bone fractures that can happen because of osteoporosis, you may take the following actions:  If you are 19-50 years old, get at least 1,000 mg of calcium and at least 600 mg of vitamin D per day.  If you are older than age 50 but younger than age 70, get at least 1,200 mg of calcium and at least 600 mg of vitamin D per day.  If you are older than age 70, get at least 1,200 mg of calcium and at least 800 mg of vitamin D per day. Smoking and drinking excessive alcohol increase the risk of osteoporosis. Eat foods that are rich in calcium and vitamin D, and do weight-bearing exercises several times each week as directed by your health care provider. How does menopause affect my mental health? Depression may occur at any age, but it is more common as you become older. Common symptoms of depression include:  Low or sad mood.  Changes in sleep patterns.  Changes in appetite or eating patterns.  Feeling an overall lack of motivation or enjoyment of activities that you previously enjoyed.  Frequent crying spells. Talk with your health care provider if you think that you are experiencing depression. General instructions See your health care provider for regular wellness exams and vaccines. This may include:  Scheduling regular health, dental, and eye exams.  Getting and maintaining your vaccines. These include: ? Influenza vaccine. Get this vaccine each year before the flu season begins. ? Pneumonia vaccine. ? Shingles vaccine. ? Tetanus, diphtheria, and pertussis (Tdap) booster vaccine. Your health care provider may also recommend other immunizations. Tell your health care provider if you have ever been abused or do not feel safe at home. Summary  Menopause is a normal process in which your ability to get pregnant comes to an  end.  This condition causes hot flashes, night sweats, decreased interest in sex, mood swings, headaches, or lack of sleep.  Treatment for this condition may include hormone replacement therapy.  Take actions to keep yourself healthy, including exercising regularly, eating a healthy diet, watching your weight, and checking your blood pressure and blood sugar levels.  Get screened for cancer and depression. Make sure that you are up to date with all your vaccines. This information is not intended to replace advice given to you by your health care provider. Make sure you discuss any questions you have with your health care provider. Document Revised: 03/05/2018 Document Reviewed: 03/05/2018 Elsevier Patient Education  2020 Elsevier Inc.  

## 2019-07-15 ENCOUNTER — Telehealth: Payer: Self-pay

## 2019-07-15 LAB — CERVICOVAGINAL ANCILLARY ONLY
Bacterial Vaginitis (gardnerella): NEGATIVE
Candida Glabrata: NEGATIVE
Candida Vaginitis: NEGATIVE
Chlamydia: NEGATIVE
Comment: NEGATIVE
Comment: NEGATIVE
Comment: NEGATIVE
Comment: NEGATIVE
Comment: NEGATIVE
Comment: NORMAL
Neisseria Gonorrhea: NEGATIVE
Trichomonas: NEGATIVE

## 2019-07-15 NOTE — Telephone Encounter (Signed)
-----   Message from Mar Daring, PA-C sent at 07/15/2019 11:40 AM EDT ----- Vaginal swab was completely negative.

## 2019-07-15 NOTE — Telephone Encounter (Signed)
Pt advised.   Thanks,   -Orlyn Odonoghue  

## 2019-07-24 ENCOUNTER — Telehealth: Payer: Self-pay

## 2019-07-24 ENCOUNTER — Ambulatory Visit
Admission: RE | Admit: 2019-07-24 | Discharge: 2019-07-24 | Disposition: A | Discharge status: Home / Self Care | Payer: Managed Care, Other (non HMO) | Source: Ambulatory Visit | Attending: Physician Assistant | Admitting: Physician Assistant

## 2019-07-24 DIAGNOSIS — Z803 Family history of malignant neoplasm of breast: Secondary | ICD-10-CM | POA: Diagnosis present

## 2019-07-24 DIAGNOSIS — Z1231 Encounter for screening mammogram for malignant neoplasm of breast: Secondary | ICD-10-CM | POA: Insufficient documentation

## 2019-07-24 NOTE — Telephone Encounter (Signed)
-----   Message from Mar Daring, [place] sent at 07/24/2019  5:34 PM EDT ----- Normal mammogram. Repeat screening in one year.

## 2019-07-24 NOTE — Telephone Encounter (Signed)
Left message to call back. If patient calls back OK for [facility] nurse to give results.

## 2019-07-27 NOTE — Telephone Encounter (Signed)
Patient advised as below.  

## 2019-09-08 ENCOUNTER — Telehealth: Payer: Self-pay

## 2019-09-08 DIAGNOSIS — E7800 Pure hypercholesterolemia, unspecified: Secondary | ICD-10-CM

## 2019-09-08 MED ORDER — PRAVASTATIN SODIUM 20 MG PO TABS: 20.0000 mg | ORAL_TABLET | Freq: Every day | ORAL | 1 refills | Status: DC

## 2019-09-08 NOTE — Telephone Encounter (Signed)
Copied from [place] [phone]. Topic: General - Inquiry >> Sep 08, 2019  4:58 PM Greggory Keen D wrote: Reason for CRM: Pt called saying since she has been taking the Atorvastatin she has been throwing up, cramps in her stomach, She wants to know if there is another cholesterol medication she can take.  She has discontinued taking the RX.  [facility] [address]  CB#  [phone]

## 2019-09-08 NOTE — Telephone Encounter (Signed)
Will send in pravastatin to see if she tolerates that better. If side effects occur stop and call.

## 2019-09-09 NOTE — Telephone Encounter (Signed)
Pt advised.   Thanks,   -Phuoc Huy  

## 2019-10-02 ENCOUNTER — Other Ambulatory Visit: Payer: Self-pay | Admitting: Physician Assistant

## 2019-10-02 DIAGNOSIS — E7800 Pure hypercholesterolemia, unspecified: Secondary | ICD-10-CM

## 2019-10-05 ENCOUNTER — Other Ambulatory Visit: Payer: Self-pay | Admitting: Physician Assistant

## 2019-10-05 DIAGNOSIS — K219 Gastro-esophageal reflux disease without esophagitis: Secondary | ICD-10-CM

## 2019-10-27 ENCOUNTER — Other Ambulatory Visit: Payer: Self-pay | Admitting: Physician Assistant

## 2019-10-27 DIAGNOSIS — E7800 Pure hypercholesterolemia, unspecified: Secondary | ICD-10-CM

## 2019-10-28 ENCOUNTER — Other Ambulatory Visit: Payer: Self-pay | Admitting: Physician Assistant

## 2019-10-28 DIAGNOSIS — E7800 Pure hypercholesterolemia, unspecified: Secondary | ICD-10-CM

## 2019-11-09 ENCOUNTER — Ambulatory Visit: Payer: Self-pay | Admitting: Physician Assistant

## 2019-11-09 NOTE — Telephone Encounter (Signed)
Ret'd call to pt.  Reported she was exposed  To COVID yesterday, by 11 mo. Old granddaughter.  Reported the baby has been sick, but the family thought she was teething, and they brought her to the patient's home.  Pt. Held the baby for approx. 3 hrs.  Found out today, that the child is positive for COVID.  Patient reported that her son's family was in her home for 3 hrs., yesterday, and has not been tested, with exception of the 11 mo. Old baby.  Pt. Stated her husband has a "terrible headache" today.  Pt. reported "I feel fine; I might have a very mild headache."   Advised of need for pt. And her husband to quarantine x 14 days, from date of exposure.  Advised that it is recommended to get tested 5-6 days, from date of exposure, unless becomes symptomatic.  Pt. Stated her husband is scheduled for testing on Friday, 8/20, prior to a Colonoscopy.  Stated she is already scheduled for testing tomorrow.  Care advice reviewed with pt.  Stated "oh, my husband had COVID in January."  Verb. Understanding.   Reason for Disposition . [1] CLOSE CONTACT COVID-19 EXPOSURE within last 14 days AND [2] NO symptoms    Held 57 mo. old granddaughter x approx. 3 hrs. Yesterday, and now granddaughter is positive for COVID.  Answer Assessment - Initial Assessment Questions 1. COVID-19 CLOSE CONTACT: "Who is the person with the confirmed or suspected COVID-19 infection that you were exposed to?"     Granddaughter and son, daughter-in-law 2. PLACE of CONTACT: "Where were you when you were exposed to COVID-19?" (e.g., home, school, medical waiting room; which city?)     At home  3. TYPE of CONTACT: "How much contact was there?" (e.g., sitting next to, live in same house, work in same office, same building)     3 hrs. Of direct contact with the baby  4. DURATION of CONTACT: "How long were you in contact with the COVID-19 patient?" (e.g., a few seconds, passed by person, a few minutes, 15 minutes or longer, live with the  patient)     Approx. 3 hrs.  5. MASK: "Were you wearing a mask?" "Was the other person wearing a mask?" Note: wearing a mask reduces the risk of an otherwise close contact.     *No Answer* 6. DATE of CONTACT: "When did you have contact with a COVID-19 patient?" (e.g., how many days ago)     11/08/19 7. COMMUNITY SPREAD: "Are there lots of cases of COVID-19 (community spread) where you live?" (See public health department website, if unsure)       *No Answer* 8. SYMPTOMS: "Do you have any symptoms?" (e.g., fever, cough, breathing difficulty, loss of taste or smell)    Mild headache 9. PREGNANCY OR POSTPARTUM: "Is there any chance you are pregnant?" "When was your last menstrual period?" "Did you deliver in the last 2 weeks?"     N/a  10. HIGH RISK: "Do you have any heart or lung problems?" "Do you have a weak immune system?" (e.g., heart failure, COPD, asthma, HIV positive, chemotherapy, renal failure, diabetes mellitus, sickle cell anemia, obesity)       Hx of pneumonia about 2 yrs. Ago; has hx of Htn.  11. TRAVEL: "Have you traveled out of the country recently?" If Yes, ask: "When and where?" Also ask about out-of-state travel, since the CDC has identified some high-risk cities for community spread in the [place]. Note: Travel becomes less relevant  if there is widespread community transmission where the patient lives.       *No Answer*  Protocols used: CORONAVIRUS (COVID-19) EXPOSURE-A-AH  Message from Sheran Luz sent at 11/09/2019 1:55 PM EDT  Summary: exposure to covid, requesting to speak with RN    Patient requesting to speak with RN to discuss exposure to covid-19. Patient would like to know protocols. Patient was exposed yesterday and would like to know how long she needs to wait to be tested and what to do in the meantime.

## 2019-11-09 NOTE — Telephone Encounter (Signed)
FYI. KW 

## 2019-11-12 ENCOUNTER — Telehealth: Payer: Self-pay

## 2019-11-12 NOTE — Telephone Encounter (Signed)
Patient advised.

## 2019-11-12 NOTE — Telephone Encounter (Signed)
Ok. She just needs to continue wearing a mask and if she develops any symptoms to give [place] a call.

## 2019-11-12 NOTE — Telephone Encounter (Signed)
Copied from [place] [phone]. Topic: General - Inquiry >> Nov 12, 2019 12:07 PM Gillis Ends D wrote: Reason for CRM: Patient states that the Covid test came back negative and she has returned to work. Please advise

## 2019-11-16 ENCOUNTER — Encounter: Payer: Self-pay | Admitting: Physician Assistant

## 2019-11-16 ENCOUNTER — Ambulatory Visit (INDEPENDENT_AMBULATORY_CARE_PROVIDER_SITE_OTHER): Payer: Managed Care, Other (non HMO) | Admitting: Physician Assistant

## 2019-11-16 DIAGNOSIS — H66003 Acute suppurative otitis media without spontaneous rupture of ear drum, bilateral: Secondary | ICD-10-CM | POA: Diagnosis not present

## 2019-11-16 MED ORDER — OFLOXACIN 0.3 % OT SOLN: 5.0000 [drp] | Freq: Every day | OTIC | 0 refills | Status: DC

## 2019-11-16 NOTE — Progress Notes (Signed)
Virtual telephone visit    Virtual Visit via Telephone Note   This visit type was conducted due to national recommendations for restrictions regarding the COVID-19 Pandemic (e.g. social distancing) in an effort to limit this patient's exposure and mitigate transmission in our community. Due to her co-morbid illnesses, this patient is at least at moderate risk for complications without adequate follow up. This format is felt to be most appropriate for this patient at this time. The patient did not have access to video technology or had technical difficulties with video requiring transitioning to audio format only (telephone). Physical exam was limited to content and character of the telephone converstion.    I connected with Grace [name] on 11/16/19 at  5:20 PM EDT by telephone and verified that I am speaking with the correct person using two identifiers.  I discussed the limitations of evaluation and management by telemedicine and the availability of in person appointments. The patient expressed understanding and agreed to proceed.  Patient location: Home Provider location: [facility]   Visit Date: 11/16/2019  Today's healthcare provider: Mar Daring, PA-C   Chief Complaint  Patient presents with  . Ear Pain   Subjective    Otalgia  There is pain in both ears. The current episode started 1 to 4 weeks ago. The problem has been gradually improving. There has been no fever. Associated symptoms include ear discharge (right ear) and headaches. Pertinent negatives include no coughing, hearing loss, rhinorrhea, sore throat or vomiting. Associated symptoms comments: Right eye-watery. Treatments tried: excedrin.    Patient has been covid 19 tested and test was negative. She is staying in isolation until 11/24/19.    Patient Active Problem List   Diagnosis Date Noted  . Family history of coronary artery disease 02/26/2017  . Prediabetes 02/26/2017  . Shortness of breath 02/26/2017    . Chest pain 02/24/2017  . OSA on CPAP 02/24/2017  . Anemia 09/15/2015  . 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 History of chlamydia 09/15/2015  . Candida vaginitis 09/15/2015  . Cyst of ovary 09/15/2015  . Mass of ovary 09/15/2015  . Adnexal pain 09/15/2015  . Post menopausal syndrome 09/15/2015   Past Medical History:  Diagnosis Date  . Anemia   . Hx of chlamydia infection   . Ovarian cyst   . Sleep apnea       Medications: Outpatient Medications Prior to Visit  Medication Sig  . ascorbic acid (VITAMIN C) 500 MG tablet Take by mouth.  . Cholecalciferol 25 MCG (1000 UT) capsule Take by mouth.  . hydrochlorothiazide (HYDRODIURIL) 25 MG tablet Take 1 tablet (25 mg total) by mouth daily.  Marland Kitchen ibuprofen (ADVIL) 200 MG tablet Take by mouth.  Marland Kitchen lisinopril (ZESTRIL) 10 MG tablet Take by mouth.  . metFORMIN (GLUCOPHAGE) 500 MG tablet Take by mouth.  Marland Kitchen omeprazole (PRILOSEC) 40 MG capsule Take 1 capsule (40 mg total) by mouth daily.  . pravastatin (PRAVACHOL) 20 MG tablet TAKE 1 TABLET BY MOUTH EVERY DAY  . senna-docusate (SENOKOT-S) 8.6-50 MG tablet Take by mouth.   No facility-administered medications prior to visit.    Review of Systems  Constitutional: Negative for fever.  HENT: Positive for congestion, ear discharge (right ear) and ear pain. Negative for hearing loss, postnasal drip, rhinorrhea, sinus pressure, sinus pain, sore throat and tinnitus.   Respiratory: Negative for cough, chest tightness and shortness of breath.   Cardiovascular: Negative for chest pain, palpitations and leg swelling.  Gastrointestinal: Negative for vomiting.  Neurological: Positive for headaches.    Last CBC Lab Results  Component Value Date   WBC 4.5 06/25/2016   HGB 12.0 06/25/2016   HCT 36.3 06/25/2016   MCV 80.8 06/25/2016   MCH 26.8 06/25/2016   RDW 13.6 06/25/2016   PLT 246 04/03/3233   Last metabolic panel Lab Results  Component Value Date   GLUCOSE 99 06/25/2016   NA 140  06/25/2016   K 3.9 06/25/2016   CL 109 06/25/2016   CO2 27 06/25/2016   BUN 12 06/25/2016   CREATININE 0.76 06/25/2016   GFRNONAA >60 06/25/2016   GFRAA >60 06/25/2016   CALCIUM 9.4 06/25/2016   PROT 7.4 06/25/2016   ALBUMIN 4.0 06/25/2016   BILITOT 0.3 06/25/2016   ALKPHOS 70 06/25/2016   AST 16 06/25/2016   ALT 14 06/25/2016   ANIONGAP 4 (L) 06/25/2016      Objective    There were no vitals taken for this visit. BP Readings from Last 3 Encounters:  07/13/19 (!) 153/82  02/26/17 (!) 164/82  12/21/16 140/80   Wt Readings from Last 3 Encounters:  07/13/19 229 lb 3.2 oz (104 kg)  02/26/17 219 lb 8 oz (99.6 kg)  12/21/16 219 lb (99.3 kg)        Assessment & Plan     1. Non-recurrent acute suppurative otitis media of both ears without spontaneous rupture of tympanic membranes Will give ofloxacin drops as below. Call if not improving and may require in person evaluation.  - ofloxacin (FLOXIN) 0.3 % OTIC solution; Place 5 drops into both ears daily. x5-7 days  Dispense: 5 mL; Refill: 0   No follow-ups on file.    I discussed the assessment and treatment plan with the patient. The patient was provided an opportunity to ask questions and all were answered. The patient agreed with the plan and demonstrated an understanding of the instructions.   The patient was advised to call back or seek an in-person evaluation if the symptoms worsen or if the condition fails to improve as anticipated.  I provided 14 minutes of non-face-to-face time during this encounter.  Reynolds Bowl, PA-C, have reviewed all documentation for this visit. The documentation on 11/18/19 for the exam, diagnosis, procedures, and orders are all accurate and complete.  [place] [facility] [phone] (phone) [phone] (fax)  Victoria Vera

## 2019-11-16 NOTE — Patient Instructions (Signed)

## 2019-12-04 ENCOUNTER — Telehealth: Payer: Self-pay

## 2019-12-04 DIAGNOSIS — H66003 Acute suppurative otitis media without spontaneous rupture of ear drum, bilateral: Secondary | ICD-10-CM

## 2019-12-04 MED ORDER — OFLOXACIN 0.3 % OT SOLN: 5.0000 [drp] | Freq: Every day | OTIC | 0 refills | Status: DC

## 2019-12-04 NOTE — Telephone Encounter (Signed)
Refilled but if her ear does not improve will need visit.

## 2019-12-04 NOTE — Telephone Encounter (Signed)
Copied from [place] [phone]. Topic: General - Other >> Dec 03, 2019  2:54 PM Hinda Lenis D wrote: Medication not working / please advise  - ofloxacin (FLOXIN) 0.3 % OTIC solution; Place 5 drops into both ears daily. x5-7 days  Dispense: 5 mL; Refill: 0

## 2020-01-02 ENCOUNTER — Other Ambulatory Visit: Payer: Self-pay | Admitting: Physician Assistant

## 2020-01-02 DIAGNOSIS — R600 Localized edema: Secondary | ICD-10-CM

## 2020-01-02 NOTE — Telephone Encounter (Signed)
Requested Prescriptions  Pending Prescriptions Disp Refills   hydrochlorothiazide (HYDRODIURIL) 25 MG tablet [Pharmacy Med Name: HYDROCHLOROTHIAZIDE 25 MG TAB] 90 tablet 0    Sig: TAKE 1 TABLET BY MOUTH EVERY DAY     Cardiovascular: Diuretics - Thiazide Failed - 01/02/2020  8:56 AM      Failed - Ca in normal range and within 360 days    Calcium  Date Value Ref Range Status  06/25/2016 9.4 8.9 - 10.3 mg/dL Final   Calcium, Total  Date Value Ref Range Status  03/29/2012 9.1 8.5 - 10.1 mg/dL Final         Failed - Cr in normal range and within 360 days    Creatinine  Date Value Ref Range Status  03/29/2012 0.98 0.60 - 1.30 mg/dL Final   Creatinine, Ser  Date Value Ref Range Status  06/25/2016 0.76 0.44 - 1.00 mg/dL Final         Failed - K in normal range and within 360 days    Potassium  Date Value Ref Range Status  06/25/2016 3.9 3.5 - 5.1 mmol/L Final  03/29/2012 3.8 3.5 - 5.1 mmol/L Final         Failed - Na in normal range and within 360 days    Sodium  Date Value Ref Range Status  06/25/2016 140 135 - 145 mmol/L Final  07/15/2013 142 137 - 147 mmol/L Final  03/29/2012 142 136 - 145 mmol/L Final         Failed - Last BP in normal range    BP Readings from Last 1 Encounters:  07/13/19 (!) 153/82         Passed - Valid encounter within last 6 months    Recent Outpatient Visits          1 month ago Non-recurrent acute suppurative otitis media of both ears without spontaneous rupture of tympanic membranes   [facility] [place], [place], [place]   5 months ago Annual physical exam   [place], [place]   3 years ago Chest pain, unspecified type   [facility] [place], [place], [place]   3 years ago Annual physical exam   [place], [place]   3 years ago Witnessed episode of apnea   [facility] [place], [place], [place]

## 2020-03-24 ENCOUNTER — Ambulatory Visit (INDEPENDENT_AMBULATORY_CARE_PROVIDER_SITE_OTHER): Payer: Managed Care, Other (non HMO) | Admitting: Physician Assistant

## 2020-03-24 ENCOUNTER — Telehealth: Payer: Self-pay

## 2020-03-24 ENCOUNTER — Encounter: Payer: Self-pay | Admitting: Physician Assistant

## 2020-03-24 VITALS — BP 113/74 | Temp 99.2°F

## 2020-03-24 DIAGNOSIS — B370 Candidal stomatitis: Secondary | ICD-10-CM | POA: Diagnosis not present

## 2020-03-24 DIAGNOSIS — J069 Acute upper respiratory infection, unspecified: Secondary | ICD-10-CM | POA: Diagnosis not present

## 2020-03-24 DIAGNOSIS — J40 Bronchitis, not specified as acute or chronic: Secondary | ICD-10-CM | POA: Diagnosis not present

## 2020-03-24 MED ORDER — ALBUTEROL SULFATE HFA 108 (90 BASE) MCG/ACT IN AERS: 2.0000 | INHALATION_SPRAY | Freq: Four times a day (QID) | RESPIRATORY_TRACT | 2 refills | Status: DC | PRN

## 2020-03-24 MED ORDER — PREDNISONE 10 MG PO TABS: ORAL_TABLET | ORAL | 0 refills | Status: DC

## 2020-03-24 MED ORDER — NYSTATIN 100000 UNIT/ML MT SUSP: 5.0000 mL | Freq: Four times a day (QID) | OROMUCOSAL | 0 refills | Status: DC

## 2020-03-24 MED ORDER — AZITHROMYCIN 250 MG PO TABS: ORAL_TABLET | ORAL | 0 refills | Status: DC

## 2020-03-24 NOTE — Telephone Encounter (Signed)
Virtual phone visit scheduled.

## 2020-03-24 NOTE — Progress Notes (Signed)
Virtual telephone visit    Virtual Visit via Telephone Note   This visit type was conducted due to national recommendations for restrictions regarding the COVID-19 Pandemic (e.g. social distancing) in an effort to limit this patient's exposure and mitigate transmission in our community. Due to her co-morbid illnesses, this patient is at least at moderate risk for complications without adequate follow up. This format is felt to be most appropriate for this patient at this time. The patient did not have access to video technology or had technical difficulties with video requiring transitioning to audio format only (telephone). Physical exam was limited to content and character of the telephone converstion.    Patient location: Home Provider location: Office   I discussed the limitations of evaluation and management by telemedicine and the availability of in person appointments. The patient expressed understanding and agreed to proceed.   Visit Date: 03/24/2020  Today's healthcare provider: Trey Sailors, PA-C   Chief Complaint  Patient presents with  . Cough  . Fever   Subjective    HPI   Patient reports cough ongoing for 3-4 days. She reports persistent cough causing chest pain. She reports cough is productive. She reports a hoarse voice. She has been pushing fluids and using cough syrup. She denies fevers. Her last measured temperature is 99.71F. She also reports she has thrush.       Medications: Outpatient Medications Prior to Visit  Medication Sig  . hydrochlorothiazide (HYDRODIURIL) 25 MG tablet TAKE 1 TABLET BY MOUTH EVERY DAY (Patient taking differently: Takes every other day)  . lisinopril (ZESTRIL) 10 MG tablet Take 10 mg by mouth daily.  . Biotin 5 MG TBDP Take by mouth. (Patient not taking: Reported on 03/24/2020)  . Cholecalciferol 25 MCG (1000 UT) capsule Take by mouth. (Patient not taking: Reported on 03/24/2020)  . ofloxacin (FLOXIN) 0.3 % OTIC solution  Place 5 drops into both ears daily. x5-7 days (Patient not taking: Reported on 03/24/2020)   No facility-administered medications prior to visit.    Review of Systems  Constitutional: Positive for fever. Negative for appetite change, chills and fatigue.  HENT: Positive for congestion.   Respiratory: Positive for cough. Negative for chest tightness and shortness of breath.   Cardiovascular: Negative for chest pain and palpitations.  Gastrointestinal: Negative for abdominal pain, nausea and vomiting.  Neurological: Negative for dizziness and weakness.      Objective    BP 113/74   Temp 99.2 F (37.3 C) (Oral)      Assessment & Plan    1. Upper respiratory tract infection, unspecified type  Counseled she likely has viral infection. Recommend respiratory testing, advised of drive through testing. Advised to make a mychart so she can see her results.   - COVID-19, Flu A+B and RSV - azithromycin (ZITHROMAX) 250 MG tablet; Take 2 pills on day 1, then take one pill on each of the following four days.  Dispense: 6 tablet; Refill: 0  2. Bronchitis  Treat as below. Counseled abx would not treat viral infections but as we are going into a long weekend she may take this if worsening.   - predniSONE (DELTASONE) 10 MG tablet; Take 6 pills on day 1, 5 pills on day 2 and so on until complete.  Dispense: 21 tablet; Refill: 0 - COVID-19, Flu A+B and RSV - albuterol (VENTOLIN HFA) 108 (90 Base) MCG/ACT inhaler; Inhale 2 puffs into the lungs every 6 (six) hours as needed for wheezing or shortness of  breath.  Dispense: 1 each; Refill: 2 - azithromycin (ZITHROMAX) 250 MG tablet; Take 2 pills on day 1, then take one pill on each of the following four days.  Dispense: 6 tablet; Refill: 0  3. Thrush   - nystatin (MYCOSTATIN) 100000 UNIT/ML suspension; Take 5 mLs (500,000 Units total) by mouth 4 (four) times daily.  Dispense: 60 mL; Refill: 0   No follow-ups on file.    I discussed the  assessment and treatment plan with the patient. The patient was provided an opportunity to ask questions and all were answered. The patient agreed with the plan and demonstrated an understanding of the instructions.   The patient was advised to call back or seek an in-person evaluation if the symptoms worsen or if the condition fails to improve as anticipated.  I provided 21 minutes of non-face-to-face time during this encounter.  ITrey Sailors, PA-C, have reviewed all documentation for this visit. The documentation on 03/24/20 for the exam, diagnosis, procedures, and orders are all accurate and complete.  The entirety of the information documented in the History of Present Illness, Review of Systems and Physical Exam were personally obtained by me. Portions of this information were initially documented by April M. Hyacinth Meeker, CMA and reviewed by me for thoroughness and accuracy.    Maryella Shivers [facility] [phone] (phone) [phone] (fax)  [facility]

## 2020-03-24 NOTE — Telephone Encounter (Signed)
Copied from CRM [id number]. Topic: General - Other >> Mar 24, 2020  8:10 AM Lyn Hollingshead D wrote: PT need a call back from nurse / bad cough / no appt till Monday / please advise

## 2020-03-26 LAB — COVID-19, FLU A+B AND RSV
Influenza A, NAA: NOT DETECTED
Influenza B, NAA: NOT DETECTED
RSV, NAA: NOT DETECTED
SARS-CoV-2, NAA: NOT DETECTED

## 2020-03-28 ENCOUNTER — Other Ambulatory Visit: Payer: Self-pay | Admitting: Physician Assistant

## 2020-03-28 DIAGNOSIS — R600 Localized edema: Secondary | ICD-10-CM

## 2020-04-06 NOTE — Progress Notes (Signed)
Patient reviewed results via mychart.

## 2020-06-21 ENCOUNTER — Other Ambulatory Visit: Payer: Self-pay | Admitting: Physician Assistant

## 2020-06-21 DIAGNOSIS — J40 Bronchitis, not specified as acute or chronic: Secondary | ICD-10-CM

## 2020-10-07 ENCOUNTER — Telehealth: Payer: Self-pay

## 2020-10-07 DIAGNOSIS — Z1231 Encounter for screening mammogram for malignant neoplasm of breast: Secondary | ICD-10-CM

## 2020-10-07 NOTE — Telephone Encounter (Signed)
Patient states that she was due for her mammogram in April but when she called to make her appt they told her that they would need authorization from [place].  She does not want to wait until august when she was able to get a cpe appt.  Can we order a mammogram at [facility].  Please advise.

## 2020-10-16 NOTE — Telephone Encounter (Signed)
Schedule for screening mammograms if Grace [name] if OK with this before her exam.

## 2020-11-03 NOTE — Telephone Encounter (Signed)
Mammogram ordered. Patient advised.

## 2020-11-18 ENCOUNTER — Encounter: Payer: Self-pay | Admitting: Family Medicine

## 2020-11-18 ENCOUNTER — Other Ambulatory Visit: Payer: Self-pay

## 2020-11-18 ENCOUNTER — Ambulatory Visit (INDEPENDENT_AMBULATORY_CARE_PROVIDER_SITE_OTHER): Payer: Managed Care, Other (non HMO) | Admitting: Family Medicine

## 2020-11-18 VITALS — BP 143/71 | HR 79 | Temp 98.9°F | Ht 67.0 in | Wt 225.5 lb

## 2020-11-18 DIAGNOSIS — R7303 Prediabetes: Secondary | ICD-10-CM

## 2020-11-18 DIAGNOSIS — Z0000 Encounter for general adult medical examination without abnormal findings: Secondary | ICD-10-CM | POA: Diagnosis not present

## 2020-11-18 DIAGNOSIS — I10 Essential (primary) hypertension: Secondary | ICD-10-CM

## 2020-11-18 DIAGNOSIS — Z800 Family history of malignant neoplasm of digestive organs: Secondary | ICD-10-CM | POA: Insufficient documentation

## 2020-11-18 DIAGNOSIS — N838 Other noninflammatory disorders of ovary, fallopian tube and broad ligament: Secondary | ICD-10-CM

## 2020-11-18 DIAGNOSIS — R600 Localized edema: Secondary | ICD-10-CM

## 2020-11-18 DIAGNOSIS — Z1211 Encounter for screening for malignant neoplasm of colon: Secondary | ICD-10-CM

## 2020-11-18 DIAGNOSIS — Z23 Encounter for immunization: Secondary | ICD-10-CM | POA: Insufficient documentation

## 2020-11-18 MED ORDER — HYDROCHLOROTHIAZIDE 25 MG PO TABS: 25.0000 mg | ORAL_TABLET | Freq: Every day | ORAL | 3 refills | Status: DC

## 2020-11-18 MED ORDER — LISINOPRIL 10 MG PO TABS: 10.0000 mg | ORAL_TABLET | Freq: Every day | ORAL | 3 refills | Status: DC

## 2020-11-18 NOTE — Assessment & Plan Note (Signed)
Generally feeling well; no acute concerns Will call insurance to discuss coverage for vaccinations

## 2020-11-18 NOTE — Assessment & Plan Note (Addendum)
Recently bought treadmill and wanted 'doctor's approval' OK to exercise- trying to lose weight, as is spouse Discussed previous use of metformin- had GI SEs and does not wish to resume Importance of diet and exercise reinforced

## 2020-11-18 NOTE — Assessment & Plan Note (Signed)
Elevated today Discussed importance of taking medication daily Patient has not been taking as she 'feels well' Patient agreed to resume daily; refills sent

## 2020-11-18 NOTE — Assessment & Plan Note (Signed)
Obesity with HTN and pre-diabetes

## 2020-11-18 NOTE — Assessment & Plan Note (Signed)
Pulled for order; needs colonoscopy

## 2020-11-18 NOTE — Progress Notes (Signed)
Complete physical exam   Patient: Grace [name]   DOB: [birth year]   60 y.o. Female  MRN: [id number] Visit Date: 11/18/2020  Today's healthcare provider: Gwyneth Sprout, FNP   Chief Complaint  Patient presents with   Annual Exam    Last 07/13/19   Subjective    Grace [name] is a 60 y.o. female who presents today for a complete physical exam.  She reports consuming a general diet. Home exercise routine includes walking 45 mins hrs per 3xs a week. She generally feels well. She reports sleeping well. She does not have additional problems to discuss today.  HPI HPI     Annual Exam    Additional comments: Last 07/13/19      Last edited by Beverlee Nims, [place] on 11/18/2020  2:45 PM.        Past Medical History:  Diagnosis Date   Anemia    Cyst of ovary 09/15/2015   Hx of chlamydia infection    Ovarian cyst    Sleep apnea    Past Surgical History:  Procedure Laterality Date   ABDOMINAL HYSTERECTOMY  2006   not due to cancer-Partial   Elbow adjustment Right 03/30/2011   had to "pop" back into place   TOTAL ABDOMINAL HYSTERECTOMY W/ BILATERAL SALPINGOOPHORECTOMY  09/07/2013   with Dr. Romelle Starcher   Social History   Socioeconomic History   Marital status: Married    Spouse name: Shonna Chock   Number of children: Not on file   Years of education: Not on file   Highest education level: Not on file  Occupational History   Not on file  Tobacco Use   Smoking status: Never   Smokeless tobacco: Never  Vaping Use   Vaping Use: Never used  Substance and Sexual Activity   Alcohol use: Yes    Comment: Occasional; once a month   Drug use: No   Sexual activity: Not on file  Other Topics Concern   Not on file  Social History Narrative   Not on file   Social Determinants of Health   Financial Resource Strain: Not on file  Food Insecurity: Not on file  Transportation Needs: Not on file  Physical Activity: Not on file  Stress: Not on file  Social Connections: Not  on file  Intimate Partner Violence: Not on file   Family Status  Relation Name Status   Mother  Deceased       Hx of stroke, high blood pressure,and diabetes   Father  Deceased       cause of death was bone cancer   Sister 53 Alive   Sister 2 Alive   Sister 3 Alive   Programmer, 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)   Cousin  (Not Specified)   Family History  Problem Relation Age of Onset   Diabetes Mother    Hypertension Mother    Stroke Mother    Heart disease Mother    Asthma Sister    Hypertension Sister    Hypertension Sister    Breast cancer Maternal Aunt    Breast cancer Cousin    Allergies  Allergen Reactions   Zinc Other (See Comments)    Cramping; abdominal pain   Atorvastatin Nausea And Vomiting   Nickel Rash    Patient Care Team: Gwyneth Sprout, FNP as PCP - General (Family Medicine)   Medications: Outpatient Medications Prior to Visit  Medication Sig   albuterol (VENTOLIN HFA) 108 (90 Base) MCG/ACT inhaler TAKE  2 PUFFS BY MOUTH EVERY 6 HOURS AS NEEDED FOR WHEEZE OR SHORTNESS OF BREATH   [DISCONTINUED] hydrochlorothiazide (HYDRODIURIL) 25 MG tablet TAKE 1 TABLET BY MOUTH EVERY DAY (Patient taking differently: Patient taking once every other day)   [DISCONTINUED] lisinopril (ZESTRIL) 10 MG tablet Take 10 mg by mouth daily.   [DISCONTINUED] azithromycin (ZITHROMAX) 250 MG tablet Take 2 pills on day 1, then take one pill on each of the following four days.   [DISCONTINUED] Biotin 5 MG TBDP Take by mouth. (Patient not taking: Reported on 03/24/2020)   [DISCONTINUED] Cholecalciferol 25 MCG (1000 UT) capsule Take by mouth. (Patient not taking: Reported on 03/24/2020)   [DISCONTINUED] nystatin (MYCOSTATIN) 100000 UNIT/ML suspension Take 5 mLs (500,000 Units total) by mouth 4 (four) times daily.   [DISCONTINUED] ofloxacin (FLOXIN) 0.3 % OTIC solution Place 5 drops into both ears daily. x5-7 days (Patient not taking: Reported on 03/24/2020)   [DISCONTINUED] predniSONE (DELTASONE) 10 MG  tablet Take 6 pills on day 1, 5 pills on day 2 and so on until complete.   No facility-administered medications prior to visit.    Review of Systems  Respiratory:  Positive for wheezing.   Musculoskeletal:  Positive for myalgias.  All other systems reviewed and are negative.  Last CBC Lab Results  Component Value Date   WBC 4.5 06/25/2016   HGB 12.0 06/25/2016   HCT 36.3 06/25/2016   MCV 80.8 06/25/2016   MCH 26.8 06/25/2016   RDW 13.6 06/25/2016   PLT 246 [id number]   Last metabolic panel Lab Results  Component Value Date   GLUCOSE 99 06/25/2016   NA 140 06/25/2016   K 3.9 06/25/2016   CL 109 06/25/2016   CO2 27 06/25/2016   BUN 12 06/25/2016   CREATININE 0.76 06/25/2016   GFRNONAA >60 06/25/2016   GFRAA >60 06/25/2016   CALCIUM 9.4 06/25/2016   PROT 7.4 06/25/2016   ALBUMIN 4.0 06/25/2016   BILITOT 0.3 06/25/2016   ALKPHOS 70 06/25/2016   AST 16 06/25/2016   ALT 14 06/25/2016   ANIONGAP 4 (L) 06/25/2016   Last lipids Lab Results  Component Value Date   CHOL 191 06/25/2016   HDL 43 06/25/2016   LDLCALC 131 (H) 06/25/2016   TRIG 85 06/25/2016   CHOLHDL 4.4 06/25/2016   Last hemoglobin A1c Lab Results  Component Value Date   HGBA1C 6.1 (H) 06/25/2016   Last thyroid functions Lab Results  Component Value Date   TSH 1.083 06/25/2016   Last vitamin D No results found for: 25OHVITD2, 25OHVITD3, VD25OH Last vitamin B12 and Folate No results found for: VITAMINB12, FOLATE    Objective    BP (!) 143/71   Pulse 79   Temp 98.9 F (37.2 C) (Oral)   Ht '5\' 7"'$  (1.702 m)   Wt 225 lb 8 oz (102.3 kg)   SpO2 100%   BMI 35.32 kg/m  BP Readings from Last 3 Encounters:  11/18/20 (!) 143/71  03/24/20 113/74  07/13/19 (!) 153/82   Wt Readings from Last 3 Encounters:  11/18/20 225 lb 8 oz (102.3 kg)  07/13/19 229 lb 3.2 oz (104 kg)  02/26/17 219 lb 8 oz (99.6 kg)      Physical Exam Vitals and nursing note reviewed.  Constitutional:      General: She  is not in acute distress.    Appearance: Normal appearance. She is obese. She is not ill-appearing, toxic-appearing or diaphoretic.  HENT:     Head: Normocephalic and atraumatic.  Right Ear: Tympanic membrane, ear canal and external ear normal. There is no impacted cerumen.     Left Ear: Tympanic membrane, ear canal and external ear normal. There is no impacted cerumen.     Nose: Nose normal. No congestion or rhinorrhea.     Mouth/Throat:     Mouth: Mucous membranes are moist.     Pharynx: No oropharyngeal exudate or posterior oropharyngeal erythema.  Eyes:     General:        Right eye: No discharge.        Left eye: No discharge.     Extraocular Movements: Extraocular movements intact.     Conjunctiva/sclera: Conjunctivae normal.     Pupils: Pupils are equal, round, and reactive to light.  Neck:     Vascular: No carotid bruit.  Cardiovascular:     Rate and Rhythm: Normal rate and regular rhythm.     Pulses: Normal pulses.     Heart sounds: Normal heart sounds. No murmur heard.   No friction rub. No gallop.  Pulmonary:     Effort: Pulmonary effort is normal. No respiratory distress.     Breath sounds: Normal breath sounds. No stridor. No wheezing, rhonchi or rales.  Chest:     Chest wall: No tenderness.  Abdominal:     General: Abdomen is flat. Bowel sounds are normal. There is no distension.     Palpations: Abdomen is soft. There is no mass.     Tenderness: There is no abdominal tenderness. There is no guarding or rebound.     Hernia: No hernia is present.  Genitourinary:    Comments: No complaints; deferred PAP for 3 months  Musculoskeletal:        General: No swelling, tenderness, deformity or signs of injury. Normal range of motion.     Cervical back: Normal range of motion and neck supple. No rigidity or tenderness.     Right lower leg: Edema present.     Left lower leg: Edema present.     Comments: Non pitting edema; discussed how she's been taking HCTZ every other.  Restart taking daily and continue to watch sodium intake.  Lymphadenopathy:     Cervical: No cervical adenopathy.  Skin:    General: Skin is warm and dry.     Capillary Refill: Capillary refill takes less than 2 seconds.     Coloration: Skin is not jaundiced or pale.     Findings: No bruising, erythema, lesion or rash.  Neurological:     General: No focal deficit present.     Mental Status: She is alert and oriented to person, place, and time.     Cranial Nerves: No cranial nerve deficit.     Sensory: No sensory deficit.     Motor: No weakness.     Coordination: Coordination normal.     Gait: Gait normal.     Deep Tendon Reflexes: Reflexes normal.  Psychiatric:        Mood and Affect: Mood normal.        Behavior: Behavior normal.        Thought Content: Thought content normal.        Judgment: Judgment normal.      Last depression screening scores PHQ 2/9 Scores 11/18/2020 07/13/2019 06/22/2016  PHQ - 2 Score 0 0 0  PHQ- 9 Score 0 - 2   Last fall risk screening Fall Risk  11/18/2020  Falls in the past year? 0  Number falls in past yr:  0  Injury with Fall? 0  Risk for fall due to : -  Follow up -   Last Audit-C alcohol use screening Alcohol Use Disorder Test (AUDIT) 11/18/2020  1. How often do you have a drink containing alcohol? 1  2. How many drinks containing alcohol do you have on a typical day when you are drinking? 0  3. How often do you have six or more drinks on one occasion? 0  AUDIT-C Score 1  Alcohol Brief Interventions/Follow-up -   A score of 3 or more in women, and 4 or more in men indicates increased risk for alcohol abuse, EXCEPT if all of the points are from question 1   No results found for any visits on 11/18/20.  Assessment & Plan    Routine Health Maintenance and Physical Exam  Exercise Activities and Dietary recommendations  Goals   None     Immunization History  Administered Date(s) Administered   Influenza,inj,Quad PF,6+ Mos 04/09/2018    PFIZER(Purple Top)SARS-COV-2 Vaccination 05/04/2019, 05/25/2019, 01/28/2020   Tdap 07/30/2012    Health Maintenance  Topic Date Due   COLONOSCOPY (Pts 45-88yr Insurance coverage will need to be confirmed)  Never done   Zoster Vaccines- Shingrix (1 of 2) Never done   PAP SMEAR-Modifier  06/23/2019   COVID-19 Vaccine (4 - Booster for Pfizer series) 05/27/2020   INFLUENZA VACCINE  10/24/2020   MAMMOGRAM  07/23/2021   TETANUS/TDAP  07/31/2022   Hepatitis C Screening  Completed   HIV Screening  Completed   Pneumococcal Vaccine [id number] old  Aged Out   HPV VACCINES  Aged Out    Discussed health benefits of physical activity, and encouraged her to engage in regular exercise appropriate for her age and condition.  Problem List Items Addressed This Visit       Cardiovascular and Mediastinum   Primary hypertension    Elevated today Discussed importance of taking medication daily Patient has not been taking as she 'feels well' Patient agreed to resume daily; refills sent      Relevant Medications   hydrochlorothiazide (HYDRODIURIL) 25 MG tablet   lisinopril (ZESTRIL) 10 MG tablet   Other Relevant Orders   CBC with Differential/Platelet   Comprehensive metabolic panel   Hemoglobin A1c   VITAMIN D 25 Hydroxy (Vit-D Deficiency, Fractures)   TSH   Lipid panel   Ambulatory referral to Gastroenterology   Varicella-zoster vaccine subcutaneous     Other   Prediabetes    Recently bought treadmill and wanted 'doctor's approval' OK to exercise- trying to lose weight, as is spouse Discussed previous use of metformin- had GI SEs and does not wish to resume Importance of diet and exercise reinforced      Relevant Medications   hydrochlorothiazide (HYDRODIURIL) 25 MG tablet   lisinopril (ZESTRIL) 10 MG tablet   Other Relevant Orders   CBC with Differential/Platelet   Comprehensive metabolic panel   Hemoglobin A1c   VITAMIN D 25 Hydroxy (Vit-D Deficiency, Fractures)   TSH    Lipid panel   Ambulatory referral to Gastroenterology   Varicella-zoster vaccine subcutaneous   Encounter for colonoscopy in patient with family history of colon cancer    Pulled for order; needs colonoscopy      Relevant Orders   Ambulatory referral to Gastroenterology   Morbid obesity ([place]    Obesity with HTN and pre-diabetes      Relevant Medications   hydrochlorothiazide (HYDRODIURIL) 25 MG tablet   lisinopril (ZESTRIL) 10 MG tablet  Other Relevant Orders   CBC with Differential/Platelet   Comprehensive metabolic panel   Hemoglobin A1c   VITAMIN D 25 Hydroxy (Vit-D Deficiency, Fractures)   TSH   Lipid panel   Ambulatory referral to Gastroenterology   Varicella-zoster vaccine subcutaneous   Preventative health care - Primary    Generally feeling well; no acute concerns Will call insurance to discuss coverage for vaccinations      Relevant Medications   hydrochlorothiazide (HYDRODIURIL) 25 MG tablet   lisinopril (ZESTRIL) 10 MG tablet   Other Relevant Orders   CBC with Differential/Platelet   Comprehensive metabolic panel   Hemoglobin A1c   VITAMIN D 25 Hydroxy (Vit-D Deficiency, Fractures)   TSH   Lipid panel   Need for shingles vaccine    Waiting on information from insurance company regarding coverage      Relevant Orders   Varicella-zoster vaccine subcutaneous   RESOLVED: Mass of ovary   Other Visit Diagnoses     Lower extremity edema       Relevant Medications   hydrochlorothiazide (HYDRODIURIL) 25 MG tablet        Return in about 3 months (around 02/18/2021) for blood pressure check.     Vonna Kotyk, FNP, have reviewed all documentation for this visit. The documentation on 11/18/20 for the exam, diagnosis, procedures, and orders are all accurate and complete.    Gwyneth Sprout, [place] [phone] (phone) [phone] (fax)  [place]

## 2020-11-18 NOTE — Assessment & Plan Note (Signed)
Waiting on information from insurance company regarding coverage

## 2020-11-19 LAB — COMPREHENSIVE METABOLIC PANEL
ALT: 16 IU/L (ref 0–32)
AST: 15 IU/L (ref 0–40)
Albumin/Globulin Ratio: 1.5 (ref 1.2–2.2)
Albumin: 4.3 g/dL (ref 3.8–4.9)
Alkaline Phosphatase: 94 IU/L (ref 44–121)
BUN/Creatinine Ratio: 14 (ref 9–23)
BUN: 14 mg/dL (ref 6–24)
Bilirubin Total: <0.2 mg/dL (ref 0.0–1.2)
CO2: 22 mmol/L (ref 20–29)
Calcium: 9.5 mg/dL (ref 8.7–10.2)
Chloride: 103 mmol/L (ref 96–106)
Creatinine, Ser: 1 mg/dL (ref 0.57–1.00)
Globulin, Total: 2.8 g/dL (ref 1.5–4.5)
Glucose: 93 mg/dL (ref 65–99)
Potassium: 3.9 mmol/L (ref 3.5–5.2)
Sodium: 141 mmol/L (ref 134–144)
Total Protein: 7.1 g/dL (ref 6.0–8.5)
eGFR: 65 mL/min/{1.73_m2} (ref >59)

## 2020-11-19 LAB — CBC WITH DIFFERENTIAL/PLATELET
Basophils Absolute: 0 10*3/uL (ref 0.0–0.2)
Basos: 1 %
EOS (ABSOLUTE): 0.1 10*3/uL (ref 0.0–0.4)
Eos: 2 %
Hematocrit: 36.7 % (ref 34.0–46.6)
Hemoglobin: 11.9 g/dL (ref 11.1–15.9)
Immature Grans (Abs): 0 10*3/uL (ref 0.0–0.1)
Immature Granulocytes: 0 %
Lymphocytes Absolute: 2.6 10*3/uL (ref 0.7–3.1)
Lymphs: 42 %
MCH: 26.3 pg — ABNORMAL LOW (ref 26.6–33.0)
MCHC: 32.4 g/dL (ref 31.5–35.7)
MCV: 81 fL (ref 79–97)
Monocytes Absolute: 0.7 10*3/uL (ref 0.1–0.9)
Monocytes: 11 %
Neutrophils Absolute: 2.8 10*3/uL (ref 1.4–7.0)
Neutrophils: 44 %
Platelets: 278 10*3/uL (ref 150–450)
RBC: 4.53 x10E6/uL (ref 3.77–5.28)
RDW: 12.7 % (ref 11.7–15.4)
WBC: 6.2 10*3/uL (ref 3.4–10.8)

## 2020-11-19 LAB — LIPID PANEL
Chol/HDL Ratio: 5.4 ratio — ABNORMAL HIGH (ref 0.0–4.4)
Cholesterol, Total: 209 mg/dL — ABNORMAL HIGH (ref 100–199)
HDL: 39 mg/dL — ABNORMAL LOW (ref >39)
LDL Chol Calc (NIH): 143 mg/dL — ABNORMAL HIGH (ref 0–99)
Triglycerides: 147 mg/dL (ref 0–149)
VLDL Cholesterol Cal: 27 mg/dL (ref 5–40)

## 2020-11-19 LAB — VITAMIN D 25 HYDROXY (VIT D DEFICIENCY, FRACTURES): Vit D, 25-Hydroxy: 22.6 ng/mL — ABNORMAL LOW (ref 30.0–100.0)

## 2020-11-19 LAB — HEMOGLOBIN A1C
Est. average glucose Bld gHb Est-mCnc: 137 mg/dL
Hgb A1c MFr Bld: 6.4 % — ABNORMAL HIGH (ref 4.8–5.6)

## 2020-11-19 LAB — TSH: TSH: 0.948 u[IU]/mL (ref 0.450–4.500)

## 2020-11-22 ENCOUNTER — Other Ambulatory Visit: Payer: Self-pay

## 2020-11-22 ENCOUNTER — Ambulatory Visit
Admission: RE | Admit: 2020-11-22 | Discharge: 2020-11-22 | Disposition: A | Discharge status: Home / Self Care | Payer: Managed Care, Other (non HMO) | Source: Ambulatory Visit | Attending: Family Medicine | Admitting: Family Medicine

## 2020-11-22 DIAGNOSIS — Z1231 Encounter for screening mammogram for malignant neoplasm of breast: Secondary | ICD-10-CM | POA: Insufficient documentation

## 2020-12-02 ENCOUNTER — Telehealth: Payer: Self-pay

## 2020-12-02 NOTE — Telephone Encounter (Signed)
Pt. Returning call.. she is ready to schedule her colonoscopy

## 2020-12-02 NOTE — Telephone Encounter (Signed)
Called patient she was at work and put me on hold waiting for a call back

## 2020-12-06 ENCOUNTER — Other Ambulatory Visit (INDEPENDENT_AMBULATORY_CARE_PROVIDER_SITE_OTHER): Payer: Self-pay

## 2020-12-06 DIAGNOSIS — Z1211 Encounter for screening for malignant neoplasm of colon: Secondary | ICD-10-CM

## 2020-12-06 MED ORDER — PEG 3350-KCL-NA BICARB-NACL 420 G PO SOLR: 4000.0000 mL | Freq: Once | ORAL | 0 refills | Status: AC

## 2020-12-06 NOTE — Progress Notes (Signed)
Gastroenterology Pre-Procedure Review  Request Date: 12/23/2020 Requesting Physician: Dr. Vicente Males  PATIENT REVIEW QUESTIONS: The patient responded to the following health history questions as indicated:    1. Are you having any GI issues? no 2. Do you have a personal history of Polyps? no 3. Do you have a family history of Colon Cancer or Polyps? yes (colon cancer ) 4. Diabetes Mellitus? no 5. Joint replacements in the past 12 months?no 6. Major health problems in the past 3 months?no 7. Any artificial heart valves, MVP, or defibrillator?no    MEDICATIONS & ALLERGIES:    Patient reports the following regarding taking any anticoagulation/antiplatelet therapy:   Plavix, Coumadin, Eliquis, Xarelto, Lovenox, Pradaxa, Brilinta, or Effient? no Aspirin? no  Patient confirms/reports the following medications:  Current Outpatient Medications  Medication Sig Dispense Refill   albuterol (VENTOLIN HFA) 108 (90 Base) MCG/ACT inhaler TAKE 2 PUFFS BY MOUTH EVERY 6 HOURS AS NEEDED FOR WHEEZE OR SHORTNESS OF BREATH 6.7 each 2   hydrochlorothiazide (HYDRODIURIL) 25 MG tablet Take 1 tablet (25 mg total) by mouth daily. 90 tablet 3   lisinopril (ZESTRIL) 10 MG tablet Take 1 tablet by mouth daily.     No current facility-administered medications for this visit.    Patient confirms/reports the following allergies:  Allergies  Allergen Reactions   Zinc Other (See Comments)    Cramping; abdominal pain   Atorvastatin Nausea And Vomiting   Nickel Rash    No orders of the defined types were placed in this encounter.   AUTHORIZATION INFORMATION Primary Insurance: 1D#: Group #:  Secondary Insurance: 1D#: Group #:  SCHEDULE INFORMATION: Date: 12/23/2020 Time: Location: [facility]

## 2020-12-12 ENCOUNTER — Other Ambulatory Visit: Payer: Self-pay | Admitting: Physician Assistant

## 2020-12-12 DIAGNOSIS — J40 Bronchitis, not specified as acute or chronic: Secondary | ICD-10-CM

## 2020-12-22 ENCOUNTER — Encounter: Payer: Self-pay | Admitting: Gastroenterology

## 2020-12-23 ENCOUNTER — Ambulatory Visit: Payer: Managed Care, Other (non HMO) | Admitting: Anesthesiology

## 2020-12-23 ENCOUNTER — Encounter: Payer: Self-pay | Admitting: Gastroenterology

## 2020-12-23 ENCOUNTER — Other Ambulatory Visit: Payer: Self-pay

## 2020-12-23 ENCOUNTER — Ambulatory Visit
Admission: RE | Admit: 2020-12-23 | Discharge: 2020-12-23 | Disposition: A | Discharge status: Home / Self Care | Payer: Managed Care, Other (non HMO) | Attending: Gastroenterology | Admitting: Gastroenterology

## 2020-12-23 ENCOUNTER — Encounter
Admission: RE | Disposition: A | Discharge status: Home / Self Care | Payer: Self-pay | Source: Home / Self Care | Attending: Gastroenterology

## 2020-12-23 DIAGNOSIS — Z91048 Other nonmedicinal substance allergy status: Secondary | ICD-10-CM | POA: Insufficient documentation

## 2020-12-23 DIAGNOSIS — Z1211 Encounter for screening for malignant neoplasm of colon: Secondary | ICD-10-CM | POA: Diagnosis present

## 2020-12-23 DIAGNOSIS — D122 Benign neoplasm of ascending colon: Secondary | ICD-10-CM | POA: Diagnosis not present

## 2020-12-23 DIAGNOSIS — Z79899 Other long term (current) drug therapy: Secondary | ICD-10-CM | POA: Insufficient documentation

## 2020-12-23 DIAGNOSIS — Z888 Allergy status to other drugs, medicaments and biological substances status: Secondary | ICD-10-CM | POA: Insufficient documentation

## 2020-12-23 HISTORY — PX: COLONOSCOPY WITH PROPOFOL: SHX5780

## 2020-12-23 SURGERY — COLONOSCOPY WITH PROPOFOL
Anesthesia: General

## 2020-12-23 MED ORDER — PROPOFOL 500 MG/50ML IV EMUL
INTRAVENOUS | Status: DC | PRN
  Administered 2020-12-23: 175 ug/kg/min via INTRAVENOUS

## 2020-12-23 MED ORDER — PROPOFOL 10 MG/ML IV BOLUS
INTRAVENOUS | Status: DC | PRN
  Administered 2020-12-23: 70 mg via INTRAVENOUS
  Administered 2020-12-23: 30 mg via INTRAVENOUS

## 2020-12-23 MED ORDER — SODIUM CHLORIDE 0.9 % IV SOLN: INTRAVENOUS | Status: DC

## 2020-12-23 MED ORDER — PROPOFOL 500 MG/50ML IV EMUL
INTRAVENOUS | Status: AC
  Filled 2020-12-23: qty 50

## 2020-12-23 NOTE — H&P (Signed)
Grace Bellows, MD [address], [place], [place], [place], [address] [id number], [place], [place], [place], [address] Phone: [phone]  Fax: [phone]  Primary Care Physician:  Gwyneth Sprout, FNP   Pre-Procedure History & Physical: HPI:  Grace [name] is a 60 y.o. female is here for an colonoscopy.   Past Medical History:  Diagnosis Date   Anemia    Cyst of ovary 09/15/2015   Hx of chlamydia infection    Ovarian cyst    Sleep apnea     Past Surgical History:  Procedure Laterality Date   ABDOMINAL HYSTERECTOMY  2006   not due to cancer-Partial   Elbow adjustment Right 03/30/2011   had to "pop" back into place   TOTAL ABDOMINAL HYSTERECTOMY W/ BILATERAL SALPINGOOPHORECTOMY  09/07/2013   with Dr. Romelle Starcher    Prior to Admission medications   Medication Sig Start Date End Date Taking? Authorizing Provider  albuterol (VENTOLIN HFA) 108 (90 Base) MCG/ACT inhaler TAKE 2 PUFFS BY MOUTH EVERY 6 HOURS AS NEEDED FOR WHEEZE OR SHORTNESS OF BREATH 12/19/20  Yes Gwyneth Sprout, FNP  hydrochlorothiazide (HYDRODIURIL) 25 MG tablet Take 1 tablet (25 mg total) by mouth daily. 11/18/20  Yes Tally Joe T, FNP  lisinopril (ZESTRIL) 10 MG tablet Take 1 tablet by mouth daily. 05/29/19   [provider]    Allergies as of 12/06/2020 - Review Complete 12/06/2020  Allergen Reaction Noted   Zinc Other (See Comments) 11/18/2020   Atorvastatin Nausea And Vomiting 09/08/2019   Nickel Rash 11/25/2015    Family History  Problem Relation Age of Onset   Diabetes Mother    Hypertension Mother    Stroke Mother    Heart disease Mother    Asthma Sister    Hypertension Sister    Hypertension Sister    Breast cancer Maternal Aunt    Breast cancer Cousin     Social History   Socioeconomic History   Marital status: Married    Spouse name: Shonna Chock   Number of children: Not on file   Years of education: Not on file   Highest education level: Not on file  Occupational  History   Not on file  Tobacco Use   Smoking status: Never   Smokeless tobacco: Never  Vaping Use   Vaping Use: Never used  Substance and Sexual Activity   Alcohol use: Yes    Comment: Occasional; once a month   Drug use: No   Sexual activity: Not on file  Other Topics Concern   Not on file  Social History Narrative   Not on file   Social Determinants of Health   Financial Resource Strain: Not on file  Food Insecurity: Not on file  Transportation Needs: Not on file  Physical Activity: Not on file  Stress: Not on file  Social Connections: Not on file  Intimate Partner Violence: Not on file    Review of Systems: See HPI, otherwise negative ROS  Physical Exam: BP (!) 144/70   Pulse 77   Temp (!) 97 F (36.1 C) (Temporal)   Resp 18   Ht 5\' 7"  (1.702 m)   Wt 96.6 kg   SpO2 100%   BMI 33.36 kg/m  General:   Alert,  pleasant and cooperative in NAD Head:  Normocephalic and atraumatic. Neck:  Supple; no masses or thyromegaly. Lungs:  Clear throughout to auscultation, normal respiratory effort.    Heart:  +S1, +S2, Regular rate and rhythm, No  edema. Abdomen:  Soft, nontender and nondistended. Normal bowel sounds, without guarding, and without rebound.   Neurologic:  Alert and  oriented x4;  grossly normal neurologically.  Impression/Plan: Grace [name] is here for an colonoscopy to be performed for Screening colonoscopy average risk   Risks, benefits, limitations, and alternatives regarding  colonoscopy have been reviewed with the patient.  Questions have been answered.  All parties agreeable.   Grace Bellows, MD  12/23/2020, 9:12 AM

## 2020-12-23 NOTE — Anesthesia Preprocedure Evaluation (Addendum)
Anesthesia Evaluation  Patient identified by MRN, date of birth, ID band Patient awake    Reviewed: Allergy & Precautions, NPO status , Patient's Chart, lab work & pertinent test results  Airway Mallampati: III  TM Distance: >3 FB Neck ROM: Full    Dental no notable dental hx.    Pulmonary neg pulmonary ROS,    Pulmonary exam normal  (-) decreased breath sounds      Cardiovascular METS: 3 - Mets hypertension, Pt. on medications + DOE  Normal cardiovascular exam     Neuro/Psych negative neurological ROS  negative psych ROS   GI/Hepatic negative GI ROS, Neg liver ROS,   Endo/Other  negative endocrine ROS  Renal/GU negative Renal ROS  negative genitourinary   Musculoskeletal negative musculoskeletal ROS (+)   Abdominal (+) + obese,   Peds negative pediatric ROS (+)  Hematology negative hematology ROS (+)   Anesthesia Other Findings   Reproductive/Obstetrics negative OB ROS                            Anesthesia Physical Anesthesia Plan  ASA: 2  Anesthesia Plan: General   Post-op Pain Management:    Induction:   PONV Risk Score and Plan: 3 and Propofol infusion and Treatment may vary due to age or medical condition  Airway Management Planned: Natural Airway and Nasal Cannula  Additional Equipment:   Intra-op Plan:   Post-operative Plan:   Informed Consent: I have reviewed the patients History and Physical, chart, labs and discussed the procedure including the risks, benefits and alternatives for the proposed anesthesia with the patient or authorized representative who has indicated his/her understanding and acceptance.     Dental advisory given  Plan Discussed with: CRNA and Anesthesiologist  Anesthesia Plan Comments:        Anesthesia Quick Evaluation

## 2020-12-23 NOTE — Op Note (Signed)
[facility] Patient Name: Grace [name] Procedure Date: 12/23/2020 9:04 AM MRN: [id number] Account #: [id number] Date of Birth: [birth year] Admit Type: Outpatient Age: 60 Room: [facility] ENDO ROOM 4 Gender: Female Note Status: Finalized Instrument Name: Jasper Riling [id number] Procedure:             Colonoscopy Indications:           Screening for colorectal malignant neoplasm Providers:             Jonathon Bellows MD, MD Referring MD:          No Local Md, MD (Referring MD) Medicines:             Monitored Anesthesia Care Complications:         No immediate complications. Procedure:             Pre-Anesthesia Assessment:                        - Prior to the procedure, a History and Physical was                         performed, and patient medications, allergies and                         sensitivities were reviewed. The patient's tolerance                         of previous anesthesia was reviewed.                        - The risks and benefits of the procedure and the                         sedation options and risks were discussed with the                         patient. All questions were answered and informed                         consent was obtained.                        - ASA Grade Assessment: II - A patient with mild                         systemic disease.                        After obtaining informed consent, the colonoscope was                         passed under direct vision. Throughout the procedure,                         the patient's blood pressure, pulse, and oxygen                         saturations were monitored continuously. The                         Colonoscope was  introduced through the anus and                         advanced to the the cecum, identified by the                         appendiceal orifice. The colonoscopy was performed                         with ease. The patient tolerated the procedure well.                          The quality of the bowel preparation was excellent. Findings:      The perianal and digital rectal examinations were normal.      A 4 mm polyp was found in the ascending colon. The polyp was sessile.       The polyp was removed with a cold snare. Resection and retrieval were       complete.      The exam was otherwise without abnormality on direct and retroflexion       views. Impression:            - One 4 mm polyp in the ascending colon, removed with                         a cold snare. Resected and retrieved.                        - The examination was otherwise normal on direct and                         retroflexion views. Recommendation:        - Discharge patient to home (with escort).                        - Resume previous diet.                        - Continue present medications.                        - Await pathology results.                        - Repeat colonoscopy for surveillance based on                         pathology results. Procedure Code(s):     --- Professional ---                        [id number], Colonoscopy, flexible; with removal of                         tumor(s), polyp(s), or other lesion(s) by snare                         technique Diagnosis Code(s):     --- Professional ---  Z12.11, Encounter for screening for malignant neoplasm                         of colon                        K63.5, Polyp of colon CPT copyright 2019 American Medical Association. All rights reserved. The codes documented in this report are preliminary and upon coder review may  be revised to meet current compliance requirements. Jonathon Bellows, MD Jonathon Bellows MD, MD 12/23/2020 9:39:53 AM This report has been signed electronically. Number of Addenda: 0 Note Initiated On: 12/23/2020 9:04 AM Scope Withdrawal Time: 0 hours 14 minutes 43 seconds  Total Procedure Duration: 0 hours 17 minutes 13 seconds  Estimated Blood Loss:  Estimated  blood loss: none.      [facility]

## 2020-12-23 NOTE — Anesthesia Postprocedure Evaluation (Signed)
Anesthesia Post Note  Patient: Grace [name]  Procedure(s) Performed: COLONOSCOPY WITH PROPOFOL  Patient location during evaluation: Endoscopy Anesthesia Type: General Level of consciousness: awake and alert Pain management: pain level controlled Vital Signs Assessment: post-procedure vital signs reviewed and stable Respiratory status: spontaneous breathing, nonlabored ventilation and respiratory function stable Cardiovascular status: blood pressure returned to baseline and stable Postop Assessment: no apparent nausea or vomiting Anesthetic complications: no   No notable events documented.   Last Vitals:  Vitals:   12/23/20 0950 12/23/20 1000  BP: 109/62 117/77  Pulse: 67 (!) 57  Resp: 17 16  Temp:    SpO2: 99% 99%    Last Pain:  Vitals:   12/23/20 1000  TempSrc:   PainSc: 0-No pain                 [place] Ouch

## 2020-12-23 NOTE — Transfer of Care (Signed)
Immediate Anesthesia Transfer of Care Note  Patient: Grace [name]  Procedure(s) Performed: COLONOSCOPY WITH PROPOFOL  Patient Location: PACU  Anesthesia Type:General  Level of Consciousness: awake  Airway & Oxygen Therapy: Patient Spontanous Breathing  Post-op Assessment: Report given to RN and Post -op Vital signs reviewed and stable  Post vital signs: Reviewed and stable  Last Vitals:  Vitals Value Taken Time  BP 102/55 12/23/20 0940  Temp    Pulse 68 12/23/20 0940  Resp    SpO2 98 % 12/23/20 0940    Last Pain:  Vitals:   12/23/20 0840  TempSrc: Temporal  PainSc: 0-No pain         Complications: No notable events documented.

## 2020-12-26 ENCOUNTER — Encounter: Payer: Self-pay | Admitting: Gastroenterology

## 2020-12-26 LAB — SURGICAL PATHOLOGY

## 2020-12-27 ENCOUNTER — Other Ambulatory Visit: Payer: Self-pay | Admitting: Family Medicine

## 2021-01-25 ENCOUNTER — Ambulatory Visit: Payer: Self-pay | Admitting: *Deleted

## 2021-01-25 ENCOUNTER — Telehealth: Payer: Self-pay

## 2021-01-25 NOTE — Telephone Encounter (Signed)
Copied from Geraldine [id number]. Topic: General - Other >> Jan 25, 2021 12:13 PM Leward Quan A wrote: Reason for CRM: Patient called in to get an appointment for a virtual visit for ringing in the ears for 3 weeks and now pain in left ear . Please call Ph# [phone]

## 2021-01-25 NOTE — Telephone Encounter (Signed)
Reason for Disposition  Ear is painful  Answer Assessment - Initial Assessment Questions 1. DESCRIPTION: "Describe the sound you are hearing." (e.g., hissing, humming, pounding, ringing)     Buzzing, ringing  2. LOCATION: "One or both ears?" If one, ask: "Which ear?"     Mostly in left ear but now both  3. SEVERITY: "How bad is it?"    - MILD - doesn't interfere with normal activities, only can hear in a quiet room    - MODERATE-SEVERE (Bothersome): interferes with work, school, sleep, or other activities      Severe interferes with work  4. ONSET: "When did this begin?" "Did it start suddenly or come on gradually?"     Approx. 3 weeks ago  5. PATTERN: "Does this come and go, or has it been constant since it started?"     Comes and goes  but now more constant  6. HEARING LOSS: "Is your hearing decreased?" (e.g., normal, decreased)       na 7. OTHER SYMPTOMS: "Do you have any other symptoms?" (e.g., dizziness, earache)     Ear pain, drainage swelling back of ear 8. PREGNANCY: "Is there any chance you are pregnant?" "When was your last menstrual period?"     na  Protocols used: Tinnitus-A-AH

## 2021-01-25 NOTE — Telephone Encounter (Signed)
Pt was prescribed something for her ear ringing before/ and now is having the same ringing for the last three weeks / Pt would like to see if that Rx can be prescribed / pt also stated her left ear is painful / please advise / pt has appt for Friday at 2pm   Called patient to review symptoms . C/o "buzzing" in left ear and now in right ear. Started 3 weeks ago . Left ear painful to touch, swelling behind ear and reports some drainage from ear. Buzzing  becoming louder. Walking slower due to "ringing" in ear. Requesting medication she was prescribed in the past for ringing in ears.  Appt already scheduled for 01/27/21 VV. Please advise if appt needs to be OV to assess inside ear. Instructed patient if pain increased to go to UC or ED. Patient reports she would like to see PCP due to hx ringing in ears. Denies fever, hearing loss. Care advise given. Patient verbalized understanding of care advise and to call back or go to [facility] or ED if symptoms worsen.

## 2021-01-26 NOTE — Telephone Encounter (Signed)
Patient advised.KW 

## 2021-01-27 ENCOUNTER — Encounter: Payer: Self-pay | Admitting: Family Medicine

## 2021-01-27 ENCOUNTER — Ambulatory Visit (INDEPENDENT_AMBULATORY_CARE_PROVIDER_SITE_OTHER): Payer: Managed Care, Other (non HMO) | Admitting: Family Medicine

## 2021-01-27 ENCOUNTER — Other Ambulatory Visit: Payer: Self-pay

## 2021-01-27 DIAGNOSIS — R0981 Nasal congestion: Secondary | ICD-10-CM | POA: Diagnosis not present

## 2021-01-27 DIAGNOSIS — H9213 Otorrhea, bilateral: Secondary | ICD-10-CM | POA: Diagnosis not present

## 2021-01-27 DIAGNOSIS — H9193 Unspecified hearing loss, bilateral: Secondary | ICD-10-CM | POA: Insufficient documentation

## 2021-01-27 DIAGNOSIS — H9203 Otalgia, bilateral: Secondary | ICD-10-CM | POA: Insufficient documentation

## 2021-01-27 MED ORDER — OFLOXACIN 0.3 % OT SOLN: 5.0000 [drp] | Freq: Two times a day (BID) | OTIC | 0 refills | Status: DC

## 2021-01-27 MED ORDER — CARBAMIDE PEROXIDE 6.5 % OT SOLN: 5.0000 [drp] | Freq: Two times a day (BID) | OTIC | 0 refills | Status: DC

## 2021-01-27 NOTE — Assessment & Plan Note (Signed)
Associated symptom- recommend OTC treatment given phone encounter

## 2021-01-27 NOTE — Assessment & Plan Note (Signed)
Offer RTC for OV or UC for assessment; ongoing s/s for 4 weeks, difficult d/t phone/desk job

## 2021-01-27 NOTE — Progress Notes (Signed)
Virtual telephone visit    Virtual Visit via Telephone Note   This visit type was conducted due to national recommendations for restrictions regarding the COVID-19 Pandemic (e.g. social distancing) in an effort to limit this patient's exposure and mitigate transmission in our community. Due to her co-morbid illnesses, this patient is at least at moderate risk for complications without adequate follow up. This format is felt to be most appropriate for this patient at this time. The patient did not have access to video technology or had technical difficulties with video requiring transitioning to audio format only (telephone). Physical exam was limited to content and character of the telephone converstion.    Patient location: home, contacted at [phone] Provider location: [facility]  I discussed the limitations of evaluation and management by telemedicine and the availability of in person appointments. The patient expressed understanding and agreed to proceed.   Visit Date: 01/27/2021  Today's healthcare provider: Gwyneth Sprout, FNP   Chief Complaint  Patient presents with   Ear Pain   Subjective    Otalgia  There is pain in the left ear. This is a new problem. The current episode started 1 to 4 weeks ago. The problem has been gradually worsening. There has been no fever. Associated symptoms include ear discharge and hearing loss. Pertinent negatives include no abdominal pain, coughing, diarrhea, headaches, neck pain, rash, rhinorrhea, sore throat or vomiting. Treatments tried: Ofloxacin.    Patient advised that risk of using the same medication could result in delayed healing or medication failure due to antibiotic resistance; patient reported using what was 'left' from previous Rx of medication and getting good results and demanded 'a refill' be placed.  Medications: Outpatient Medications Prior to Visit  Medication Sig   albuterol (VENTOLIN HFA) 108 (90  Base) MCG/ACT inhaler TAKE 2 PUFFS BY MOUTH EVERY 6 HOURS AS NEEDED FOR WHEEZE OR SHORTNESS OF BREATH   hydrochlorothiazide (HYDRODIURIL) 25 MG tablet Take 1 tablet (25 mg total) by mouth daily.   lisinopril (ZESTRIL) 10 MG tablet Take 1 tablet by mouth daily.   No facility-administered medications prior to visit.    Review of Systems  HENT:  Positive for ear discharge, ear pain and hearing loss. Negative for rhinorrhea and sore throat.   Respiratory:  Negative for cough.   Gastrointestinal:  Negative for abdominal pain, diarrhea and vomiting.  Musculoskeletal:  Negative for neck pain.  Skin:  Negative for rash.  Neurological:  Negative for headaches.     Objective    There were no vitals taken for this visit.     Assessment & Plan     Problem List Items Addressed This Visit       Nervous and Auditory   Bilateral hearing loss    Recommend OTC debrox; unable to assess given phone visit Recommend in office appt or UC for assessment      Relevant Medications   carbamide peroxide (DEBROX) 6.5 % OTIC solution   ofloxacin (FLOXIN) 0.3 % OTIC solution     Other   Nasal congestion    Associated symptom- recommend OTC treatment given phone encounter      Otalgia of both ears - Primary    Offer RTC for OV or UC for assessment; ongoing s/s for 4 weeks, difficult d/t phone/desk job      Relevant Medications   carbamide peroxide (DEBROX) 6.5 % OTIC solution   ofloxacin (FLOXIN) 0.3 % OTIC solution   Ear discharge of both ears  Patient reported s/s; unable to assess d/c given phone visit      Relevant Medications   carbamide peroxide (DEBROX) 6.5 % OTIC solution   ofloxacin (FLOXIN) 0.3 % OTIC solution     Return if symptoms worsen or fail to improve.    I discussed the assessment and treatment plan with the patient. The patient was provided an opportunity to ask questions and all were answered. The patient agreed with the plan and demonstrated an understanding of  the instructions.   The patient was advised to call back or seek an in-person evaluation if the symptoms worsen or if the condition fails to improve as anticipated.  I provided 6 minutes of non-face-to-face time during this encounter.  Vonna Kotyk, FNP, have reviewed all documentation for this visit. The documentation on 01/27/21 for the exam, diagnosis, procedures, and orders are all accurate and complete.   Gwyneth Sprout, [place] [phone] (phone) [phone] (fax)  [place]

## 2021-01-27 NOTE — Assessment & Plan Note (Signed)
Recommend OTC debrox; unable to assess given phone visit Recommend in office appt or UC for assessment

## 2021-01-27 NOTE — Assessment & Plan Note (Signed)
Patient reported s/s; unable to assess d/c given phone visit

## 2021-03-02 NOTE — Progress Notes (Deleted)
Established patient visit   Patient: Grace [name]   DOB: Jul 03, 1960   60 y.o. Female  MRN: [id number] Visit Date: 03/03/2021  Today's healthcare provider: Gwyneth Sprout, FNP   Chief Complaint  Patient presents with  . Hypertension   Subjective    HPI  Hypertension, follow-up  BP Readings from Last 3 Encounters:  03/03/21 (!) 142/66  12/23/20 117/77  11/18/20 (!) 143/71   Wt Readings from Last 3 Encounters:  03/03/21 228 lb (103.4 kg)  12/23/20 213 lb (96.6 kg)  11/18/20 225 lb 8 oz (102.3 kg)     She was last seen for hypertension 3 months ago.  BP at that visit was 143/71. Management since that visit includes  hydrochlorothiazide (HYDRODIURIL) 25 MG tablet     lisinopril (ZESTRIL) 10 MG tablet  .  She reports excellent compliance with treatment. Patient reports that she takes Lisinopril PRN.  She is not having side effects. {document side effects if present:1} She is following a Regular diet. She is not exercising. She does not smoke.  Use of agents associated with hypertension: none.   Outside blood pressures recent reading at home of 110/50. Symptoms: No chest pain No chest pressure  No palpitations No syncope  No dyspnea No orthopnea  No paroxysmal nocturnal dyspnea No lower extremity edema   Pertinent labs: Lab Results  Component Value Date   CHOL 209 (H) 11/18/2020   HDL 39 (L) 11/18/2020   LDLCALC 143 (H) 11/18/2020   TRIG 147 11/18/2020   CHOLHDL 5.4 (H) 11/18/2020   Lab Results  Component Value Date   NA 141 11/18/2020   K 3.9 11/18/2020   CREATININE 1.00 11/18/2020   EGFR 65 11/18/2020   GLUCOSE 93 11/18/2020   TSH 0.948 11/18/2020     The 10-year ASCVD risk score (Arnett DK, et al., 2019) is: 11.3%   ---------------------------------------------------------------------------------------------------   {Link to patient history deactivated due to formatting error:1}  Medications: Outpatient Medications Prior to Visit   Medication Sig Note  . albuterol (VENTOLIN HFA) 108 (90 Base) MCG/ACT inhaler TAKE 2 PUFFS BY MOUTH EVERY 6 HOURS AS NEEDED FOR WHEEZE OR SHORTNESS OF BREATH   . hydrochlorothiazide (HYDRODIURIL) 25 MG tablet Take 1 tablet (25 mg total) by mouth daily.   Marland Kitchen lisinopril (ZESTRIL) 10 MG tablet Take 1 tablet by mouth daily. 03/03/2021: Patient reports PRN  . carbamide peroxide (DEBROX) 6.5 % OTIC solution Place 5 drops into both ears 2 (two) times daily.   Marland Kitchen ofloxacin (FLOXIN) 0.3 % OTIC solution Place 5 drops into both ears 2 (two) times daily. (Patient not taking: Reported on 03/03/2021)    No facility-administered medications prior to visit.    Review of Systems  {Labs  Heme  Chem  Endocrine  Serology  Results Review (optional):23779}   Objective    BP (!) 142/66   Pulse 87   Resp 16   Wt 228 lb (103.4 kg)   SpO2 99%   BMI 35.71 kg/m  {Show previous vital signs (optional):23777}  Physical Exam Vitals and nursing note reviewed.  Constitutional:      General: She is not in acute distress.    Appearance: Normal appearance. She is obese. She is not ill-appearing, toxic-appearing or diaphoretic.  HENT:     Head: Normocephalic and atraumatic.     Right Ear: Tympanic membrane, ear canal and external ear normal.     Left Ear: Tympanic membrane, ear canal and external ear normal.  Ears:     Comments: Wax in bilateral ears; however, not impacted Encouraged to continue to use medication at home to assist with wax  Cardiovascular:     Rate and Rhythm: Normal rate and regular rhythm.     Pulses: Normal pulses.     Heart sounds: Normal heart sounds. No murmur heard.   No friction rub. No gallop.  Pulmonary:     Effort: Pulmonary effort is normal. No respiratory distress.     Breath sounds: Normal breath sounds. No stridor. No wheezing, rhonchi or rales.  Chest:     Chest wall: No tenderness.  Abdominal:     General: Bowel sounds are normal.     Palpations: Abdomen is soft.   Musculoskeletal:        General: No swelling, tenderness, deformity or signs of injury. Normal range of motion.     Right lower leg: No edema.     Left lower leg: No edema.  Skin:    General: Skin is warm and dry.     Capillary Refill: Capillary refill takes less than 2 seconds.     Coloration: Skin is not jaundiced or pale.     Findings: No bruising, erythema, lesion or rash.  Neurological:     General: No focal deficit present.     Mental Status: She is alert and oriented to person, place, and time. Mental status is at baseline.     Cranial Nerves: No cranial nerve deficit.     Sensory: No sensory deficit.     Motor: No weakness.     Coordination: Coordination normal.  Psychiatric:        Mood and Affect: Mood normal.        Behavior: Behavior normal.        Thought Content: Thought content normal.        Judgment: Judgment normal.    ***  No results found for any visits on 03/03/21.  Assessment & Plan     ***  No follow-ups on file.      {provider attestation***:1}   Gwyneth Sprout, [place] [phone] (phone) [phone] (fax)  [place]

## 2021-03-03 ENCOUNTER — Encounter: Payer: Self-pay | Admitting: Family Medicine

## 2021-03-03 ENCOUNTER — Other Ambulatory Visit: Payer: Self-pay

## 2021-03-03 ENCOUNTER — Ambulatory Visit (INDEPENDENT_AMBULATORY_CARE_PROVIDER_SITE_OTHER): Payer: Managed Care, Other (non HMO) | Admitting: Family Medicine

## 2021-03-03 VITALS — BP 142/66 | HR 87 | Resp 16 | Wt 228.0 lb

## 2021-03-03 DIAGNOSIS — H9203 Otalgia, bilateral: Secondary | ICD-10-CM

## 2021-03-03 DIAGNOSIS — I10 Essential (primary) hypertension: Secondary | ICD-10-CM

## 2021-03-03 DIAGNOSIS — Z23 Encounter for immunization: Secondary | ICD-10-CM | POA: Diagnosis not present

## 2021-03-03 DIAGNOSIS — H9313 Tinnitus, bilateral: Secondary | ICD-10-CM | POA: Diagnosis not present

## 2021-03-03 DIAGNOSIS — Z6835 Body mass index (BMI) 35.0-35.9, adult: Secondary | ICD-10-CM

## 2021-03-03 DIAGNOSIS — J310 Chronic rhinitis: Secondary | ICD-10-CM | POA: Insufficient documentation

## 2021-03-03 MED ORDER — PREDNISONE 20 MG PO TABS: 20.0000 mg | ORAL_TABLET | Freq: Every day | ORAL | 0 refills | Status: DC

## 2021-03-03 MED ORDER — CETIRIZINE HCL 10 MG PO TABS: 10.0000 mg | ORAL_TABLET | Freq: Every day | ORAL | 11 refills | Status: DC

## 2021-03-03 MED ORDER — FLUTICASONE PROPIONATE 50 MCG/ACT NA SUSP: 2.0000 | Freq: Every day | NASAL | 6 refills | Status: DC

## 2021-03-03 NOTE — Assessment & Plan Note (Signed)
Pt reports poor compliance with ACEi d/t fatigue Continue to reinforce regular use as well as dietary changes and exercise Stable today

## 2021-03-03 NOTE — Progress Notes (Deleted)
      Established patient visit   Patient: Grace [name]   DOB: Dec 16, 1960   60 y.o. Female  MRN: [id number] Visit Date: 03/03/2021  Today's healthcare provider: Gwyneth Sprout, FNP   Chief Complaint  Patient presents with   Hypertension   Subjective    HPI  Hypertension, follow-up  BP Readings from Last 3 Encounters:  03/03/21 (!) 142/66  12/23/20 117/77  11/18/20 (!) 143/71   Wt Readings from Last 3 Encounters:  03/03/21 228 lb (103.4 kg)  12/23/20 213 lb (96.6 kg)  11/18/20 225 lb 8 oz (102.3 kg)     She was last seen for hypertension 3 months ago.  BP at that visit was 143/71. Management since that visit includes continue Lisinopril 77m HCTZ 296m  She reports poor compliance with treatment. Patient reports that she takes Lisinopril PRN and HCTZ qd She is having side effects. Patient reports that Lisinopril causes her to feel fatigued.  She is following a Regular diet. She is not exercising. She does not smoke.  Use of agents associated with hypertension: NSAIDS.   Outside blood pressures are checked occasionally. Symptoms: No chest pain No chest pressure  No palpitations No syncope  No dyspnea No orthopnea  No paroxysmal nocturnal dyspnea No lower extremity edema   Pertinent labs: Lab Results  Component Value Date   CHOL 209 (H) 11/18/2020   HDL 39 (L) 11/18/2020   LDLCALC 143 (H) 11/18/2020   TRIG 147 11/18/2020   CHOLHDL 5.4 (H) 11/18/2020   Lab Results  Component Value Date   NA 141 11/18/2020   K 3.9 11/18/2020   CREATININE 1.00 11/18/2020   EGFR 65 11/18/2020   GLUCOSE 93 11/18/2020   TSH 0.948 11/18/2020     The 10-year ASCVD risk score (Arnett DK, et al., 2019) is: 11.3%   ---------------------------------------------------------------------------------------------------   {Link to patient history deactivated due to formatting error:1}  Medications: Outpatient Medications Prior to Visit  Medication Sig Note   albuterol (VENTOLIN  HFA) 108 (90 Base) MCG/ACT inhaler TAKE 2 PUFFS BY MOUTH EVERY 6 HOURS AS NEEDED FOR WHEEZE OR SHORTNESS OF BREATH    hydrochlorothiazide (HYDRODIURIL) 25 MG tablet Take 1 tablet (25 mg total) by mouth daily.    lisinopril (ZESTRIL) 10 MG tablet Take 1 tablet by mouth daily. 03/03/2021: Patient reports PRN   [DISCONTINUED] carbamide peroxide (DEBROX) 6.5 % OTIC solution Place 5 drops into both ears 2 (two) times daily.    [DISCONTINUED] ofloxacin (FLOXIN) 0.3 % OTIC solution Place 5 drops into both ears 2 (two) times daily. (Patient not taking: Reported on 03/03/2021)    No facility-administered medications prior to visit.    Review of Systems  {Labs  Heme  Chem  Endocrine  Serology  Results Review (optional):23779}   Objective    BP (!) 142/66   Pulse 87   Resp 16   Wt 228 lb (103.4 kg)   SpO2 99%   BMI 35.71 kg/m  {Show previous vital signs (optional):23777}  Physical Exam  ***  No results found for any visits on 03/03/21.  Assessment & Plan     ***  No follow-ups on file.      {provider attestation***:1}   ElGwyneth [phone]) [phone])  [place]

## 2021-03-03 NOTE — Progress Notes (Signed)
Established patient visit   Patient: Grace [name]   DOB: April 05, 1960   60 y.o. Female  MRN: [id number] Visit Date: 03/03/2021  Today's healthcare provider: Gwyneth Sprout, FNP   Chief Complaint  Patient presents with   Hypertension   Subjective    HPI  Hypertension, follow-up  BP Readings from Last 3 Encounters:  03/03/21 (!) 142/66  12/23/20 117/77  11/18/20 (!) 143/71   Wt Readings from Last 3 Encounters:  03/03/21 228 lb (103.4 kg)  12/23/20 213 lb (96.6 kg)  11/18/20 225 lb 8 oz (102.3 kg)     She was last seen for hypertension 3 months ago.  BP at that visit was 143/71. Management since that visit includes continue Lisinopril 34m HCTZ 255m  She reports poor compliance with treatment. Patient reports that she takes Lisinopril PRN and HCTZ qd She is having side effects. Patient reports that Lisinopril causes her to feel fatigued.  She is following a Regular diet. She is not exercising. She does not smoke.  Use of agents associated with hypertension: NSAIDS.   Outside blood pressures are checked occasionally. Symptoms: No chest pain No chest pressure  No palpitations No syncope  No dyspnea No orthopnea  No paroxysmal nocturnal dyspnea No lower extremity edema   Pertinent labs: Lab Results  Component Value Date   CHOL 209 (H) 11/18/2020   HDL 39 (L) 11/18/2020   LDLCALC 143 (H) 11/18/2020   TRIG 147 11/18/2020   CHOLHDL 5.4 (H) 11/18/2020   Lab Results  Component Value Date   NA 141 11/18/2020   K 3.9 11/18/2020   CREATININE 1.00 11/18/2020   EGFR 65 11/18/2020   GLUCOSE 93 11/18/2020   TSH 0.948 11/18/2020     The 10-year ASCVD risk score (Arnett DK, et al., 2019) is: 11.3%   ---------------------------------------------------------------------------------------------------   Medications: Outpatient Medications Prior to Visit  Medication Sig Note   albuterol (VENTOLIN HFA) 108 (90 Base) MCG/ACT inhaler TAKE 2 PUFFS BY MOUTH EVERY 6  HOURS AS NEEDED FOR WHEEZE OR SHORTNESS OF BREATH    hydrochlorothiazide (HYDRODIURIL) 25 MG tablet Take 1 tablet (25 mg total) by mouth daily.    lisinopril (ZESTRIL) 10 MG tablet Take 1 tablet by mouth daily. 03/03/2021: Patient reports PRN   [DISCONTINUED] carbamide peroxide (DEBROX) 6.5 % OTIC solution Place 5 drops into both ears 2 (two) times daily.    [DISCONTINUED] ofloxacin (FLOXIN) 0.3 % OTIC solution Place 5 drops into both ears 2 (two) times daily. (Patient not taking: Reported on 03/03/2021)    No facility-administered medications prior to visit.    Review of Systems     Objective    BP (!) 142/66   Pulse 87   Resp 16   Wt 228 lb (103.4 kg)   SpO2 99%   BMI 35.71 kg/m    Physical Exam Vitals and nursing note reviewed.  Constitutional:      General: She is not in acute distress.    Appearance: Normal appearance. She is obese. She is not ill-appearing, toxic-appearing or diaphoretic.  HENT:     Head: Normocephalic and atraumatic.     Right Ear: Tympanic membrane, ear canal and external ear normal.     Left Ear: Tympanic membrane, ear canal and external ear normal.  Cardiovascular:     Rate and Rhythm: Normal rate and regular rhythm.     Pulses: Normal pulses.     Heart sounds: Normal heart sounds. No murmur heard.  No friction rub. No gallop.  Pulmonary:     Effort: Pulmonary effort is normal. No respiratory distress.     Breath sounds: Normal breath sounds. No stridor. No wheezing, rhonchi or rales.  Chest:     Chest wall: No tenderness.  Abdominal:     General: Bowel sounds are normal.     Palpations: Abdomen is soft.  Musculoskeletal:        General: No swelling, tenderness, deformity or signs of injury. Normal range of motion.     Right lower leg: No edema.     Left lower leg: No edema.  Skin:    General: Skin is warm and dry.     Capillary Refill: Capillary refill takes less than 2 seconds.     Coloration: Skin is not jaundiced or pale.      Findings: No bruising, erythema, lesion or rash.  Neurological:     General: No focal deficit present.     Mental Status: She is alert and oriented to person, place, and time. Mental status is at baseline.     Cranial Nerves: No cranial nerve deficit.     Sensory: No sensory deficit.     Motor: No weakness.     Coordination: Coordination normal.  Psychiatric:        Mood and Affect: Mood normal.        Behavior: Behavior normal.        Thought Content: Thought content normal.        Judgment: Judgment normal.     No results found for any visits on 03/03/21.  Assessment & Plan     Problem List Items Addressed This Visit       Cardiovascular and Mediastinum   Primary hypertension - Primary    Pt reports poor compliance with ACEi d/t fatigue Continue to reinforce regular use as well as dietary changes and exercise Stable today        Respiratory   Chronic rhinitis   Relevant Medications   fluticasone (FLONASE) 50 MCG/ACT nasal spray   cetirizine (ZYRTEC) 10 MG tablet     Other   Need for shingles vaccine   Relevant Orders   Varicella-zoster vaccine IM (Completed)   Otalgia of both ears   Relevant Medications   predniSONE (DELTASONE) 20 MG tablet   Other Relevant Orders   Ambulatory referral to ENT   Tinnitus of both ears   Relevant Orders   Ambulatory referral to ENT   Flu vaccine need   Relevant Orders   Flu Vaccine QUAD 53moIM (Fluarix, Fluzone & Alfiuria Quad PF) (Completed)   Class 2 severe obesity due to excess calories with serious comorbidity and body mass index (BMI) of 35.0 to 35.9 in adult ([facility]     Return in about 6 months (around 09/01/2021), or if symptoms worsen or fail to improve.      IVonna Kotyk FNP, have reviewed all documentation for this visit. The documentation on 03/03/21 for the exam, diagnosis, procedures, and orders are all accurate and complete.    EGwyneth Sprout [phone] (phone) [phone](fax)  [place]

## 2021-05-01 ENCOUNTER — Ambulatory Visit: Payer: Self-pay

## 2021-05-01 ENCOUNTER — Ambulatory Visit: Payer: Self-pay | Admitting: *Deleted

## 2021-05-01 NOTE — Telephone Encounter (Signed)
Summary: Covid positive requesting  medication   Patient called in to inform Ms Grace [name] say that she tested positive for Covid on 04/30/21 has been having headache, vomiting, loss of taste head congestion and need medication sent to her pharmacy. Please call Ph# [phone]       Chief Complaint: covid Symptoms: fever, slight resp difficulty Frequency: constant Pertinent Negatives: Patient denies vomiting Disposition: [] ED /[x] Urgent Care (no appt availability in office) / [] Appointment(In office/virtual)/ []  [place] [facility]/ [] Home Care/ [] Refused Recommended Disposition /[] [place] [facility]/ []  Follow-up with PCP Additional Notes: Pt has asthma, pt going to UC due to no office visit today.   Reason for Disposition  MILD difficulty breathing (e.g., minimal/no SOB at rest, SOB with walking, pulse <100)  [1] Fever > 101 F (38.3 C) AND [2] age > 60 years  [1] Fever > 100.0 F (37.8 C) AND [2] bedridden (e.g., nursing home patient, CVA, chronic illness, recovering from surgery)  Answer Assessment - Initial Assessment Questions 1. COVID-19 DIAGNOSIS: "Who made your COVID-19 diagnosis?" "Was it confirmed by a positive lab test or self-test?" If not diagnosed by a doctor (or NP/PA), ask "Are there lots of cases (community spread) where you live?" Note: See public health department website, if unsure.     [facility] received results today 2. COVID-19 EXPOSURE: "Was there any known exposure to COVID before the symptoms began?" CDC Definition of close contact: within 6 feet (2 meters) for a total of 15 minutes or more over a 24-hour period.      yes 3. ONSET: "When did the COVID-19 symptoms start?"      Symptoms started Saturday 4. WORST SYMPTOM: "What is your worst symptom?" (e.g., cough, fever, shortness of breath, muscle aches)     headache 5. COUGH: "Do you have a cough?" If Yes, ask: "How bad is the cough?"       Coughing 6. FEVER: "Do you have a fever?" If Yes,  ask: "What is your temperature, how was it measured, and when did it start?"     102 7. RESPIRATORY STATUS: "Describe your breathing?" (e.g., shortness of breath, wheezing, unable to speak)      Little  short of breath 8. BETTER-SAME-WORSE: "Are you getting better, staying the same or getting worse compared to yesterday?"  If getting worse, ask, "In what way?"     Maybe same but dark circles around eyes 9. HIGH RISK DISEASE: "Do you have any chronic medical problems?" (e.g., asthma, heart or lung disease, weak immune system, obesity, etc.)     asthma 10. VACCINE: "Have you had the COVID-19 vaccine?" If Yes, ask: "Which one, how many shots, when did you get it?"       Yes, Phizer 11. BOOSTER: "Have you received your COVID-19 booster?" If Yes, ask: "Which one and when did you get it?"       no 12. PREGNANCY: "Is there any chance you are pregnant?" "When was your last menstrual period?"       no 13. OTHER SYMPTOMS: "Do you have any other symptoms?"  (e.g., chills, fatigue, headache, loss of smell or taste, muscle pain, sore throat)      No smell or taste, chills 14. O2 SATURATION MONITOR:  "Do you use an oxygen saturation monitor (pulse oximeter) at home?" If Yes, ask "What is your reading (oxygen level) today?" "What is your usual oxygen saturation reading?" (e.g., 95%)       no  Protocols used: Coronavirus (COVID-19)  Diagnosed or Suspected-A-AH

## 2021-05-01 NOTE — Telephone Encounter (Signed)
°  Chief Complaint: COVID Symptoms: chills, sore throat, headache, cough, loss of taste and smell.  Frequency: since Saturday Pertinent Negatives: Patient denies fever or SOB Disposition: [] ED /[] Urgent Care (no appt availability in office) / [x] Appointment(In office/virtual)/ []  [facility]/ [] Home Care/ [] Refused Recommended Disposition /[] [place] [facility]/ []  Follow-up with PCP Additional Notes: pt was also advised on vaccines as well.    Summary: vaccine question   Pt's husband asking, when after testing positive for COVID today 05/01/2021 can pt get a booster?  Pt's husband would like to know if it is okay to get shingles shot in the next few weeks after testing positive for COVID.     Seeking clinical advice.      Reason for Disposition  COVID-19 vaccine, Frequently Asked Questions (FAQs)  [1] HIGH RISK for severe COVID complications (e.g., weak immune system, age > 82 years, obesity with BMI > 25, pregnant, chronic lung disease or other chronic medical condition) AND [2] COVID symptoms (e.g., cough, fever)  (Exceptions: Already seen by PCP and no new or worsening symptoms.)  Answer Assessment - Initial Assessment Questions 1. COVID-19 DIAGNOSIS: "Who made your COVID-19 diagnosis?" "Was it confirmed by a positive lab test or self-test?" If not diagnosed by a doctor (or NP/PA), ask "Are there lots of cases (community spread) where you live?" Note: See public health department website, if unsure.    [facility]  3. ONSET: "When did the COVID-19 symptoms start?"      Saturday  5. COUGH: "Do you have a cough?" If Yes, ask: "How bad is the cough?"       yes 6. FEVER: "Do you have a fever?" If Yes, ask: "What is your temperature, how was it measured, and when did it start?"     Low grade  7. RESPIRATORY STATUS: "Describe your breathing?" (e.g., shortness of breath, wheezing, unable to speak)      no 9. HIGH RISK DISEASE: "Do you have any chronic medical  problems?" (e.g., asthma, heart or lung disease, weak immune system, obesity, etc.)     asthma 10. VACCINE: "Have you had the COVID-19 vaccine?" If Yes, ask: "Which one, how many shots, when did you get it?"       yes 11. BOOSTER: "Have you received your COVID-19 booster?" If Yes, ask: "Which one and when did you get it?"       yes  13. OTHER SYMPTOMS: "Do you have any other symptoms?"  (e.g., chills, fatigue, headache, loss of smell or taste, muscle pain, sore throat)       Chills, sore throat, headache, dizziness when standing, loss of taste and smell.  Protocols used: Coronavirus (CVELF-81) Vaccine Questions and Reactions-A-AH, Coronavirus (OFBPZ-02) Diagnosed or Suspected-A-AH

## 2021-05-03 ENCOUNTER — Telehealth (INDEPENDENT_AMBULATORY_CARE_PROVIDER_SITE_OTHER): Payer: Managed Care, Other (non HMO) | Admitting: Family Medicine

## 2021-05-03 ENCOUNTER — Other Ambulatory Visit: Payer: Self-pay

## 2021-05-03 DIAGNOSIS — U071 COVID-19: Secondary | ICD-10-CM | POA: Diagnosis not present

## 2021-05-03 DIAGNOSIS — R051 Acute cough: Secondary | ICD-10-CM

## 2021-05-03 MED ORDER — PREDNISONE 10 MG PO TABS: ORAL_TABLET | ORAL | 0 refills | Status: AC

## 2021-05-03 MED ORDER — NIRMATRELVIR/RITONAVIR (PAXLOVID)TABLET: 3.0000 | ORAL_TABLET | Freq: Two times a day (BID) | ORAL | 0 refills | Status: AC

## 2021-05-03 MED ORDER — HYDROCODONE BIT-HOMATROP MBR 5-1.5 MG/5ML PO SOLN: 5.0000 mL | Freq: Three times a day (TID) | ORAL | 0 refills | Status: DC | PRN

## 2021-05-03 NOTE — Progress Notes (Signed)
MyChart Video Visit    Virtual Visit via Video Note   This visit type was conducted due to national recommendations for restrictions regarding the COVID-19 Pandemic (e.g. social distancing) in an effort to limit this patient's exposure and mitigate transmission in our community. This patient is at least at moderate risk for complications without adequate follow up. This format is felt to be most appropriate for this patient at this time. Physical exam was limited by quality of the video and audio technology used for the visit.   Patient location: home Provider location: [facility]  I discussed the limitations of evaluation and management by telemedicine and the availability of in person appointments. The patient expressed understanding and agreed to proceed.  Patient: Grace [name]   DOB: [birth year]   61 y.o. Female  MRN: [id number] Visit Date: 05/03/2021  Today's healthcare provider: Lelon Huh, MD   Chief Complaint  Patient presents with   Covid Positive   Subjective    Cough This is a new problem. Episode onset: 4 days ago. The problem has been gradually improving. The problem occurs constantly. The cough is Productive of sputum (clear sputum). Associated symptoms include a fever (up to 103), headaches, myalgias, nasal congestion, postnasal drip, a sore throat and shortness of breath. Pertinent negatives include no chest pain or chills. Treatments tried: Albuterol inhaler, Flonase nasal spray and Robitussin DM. The treatment provided mild relief.   Patient took a home COVID test 3 days ago and the result was positive. She also [facility] and the result was positive.    Medications: Outpatient Medications Prior to Visit  Medication Sig   albuterol (VENTOLIN HFA) 108 (90 Base) MCG/ACT inhaler TAKE 2 PUFFS BY MOUTH EVERY 6 HOURS AS NEEDED FOR WHEEZE OR SHORTNESS OF BREATH   cetirizine (ZYRTEC) 10 MG tablet Take 1 tablet (10 mg total) by mouth daily.    fluticasone (FLONASE) 50 MCG/ACT nasal spray Place 2 sprays into both nostrils daily.   hydrochlorothiazide (HYDRODIURIL) 25 MG tablet Take 1 tablet (25 mg total) by mouth daily.   lisinopril (ZESTRIL) 10 MG tablet Take 1 tablet by mouth daily.   [DISCONTINUED] predniSONE (DELTASONE) 20 MG tablet Take 1 tablet (20 mg total) by mouth daily with breakfast. (Patient not taking: Reported on 05/03/2021)   No facility-administered medications prior to visit.    Review of Systems  Constitutional:  Positive for appetite change, fatigue and fever (up to 103). Negative for chills.       Loss of taste and smell  HENT:  Positive for congestion, postnasal drip, sore throat, trouble swallowing and voice change.   Eyes:  Positive for discharge.  Respiratory:  Positive for cough and shortness of breath. Negative for chest tightness.   Cardiovascular:  Negative for chest pain and palpitations.  Gastrointestinal:  Positive for nausea and vomiting. Negative for abdominal pain.  Musculoskeletal:  Positive for myalgias.  Neurological:  Positive for weakness and headaches. Negative for dizziness.     Objective    There were no vitals taken for this visit.   Physical Exam   Awake, alert, oriented x 3. In no apparent distress. Coughing throughout interview.      Assessment & Plan     1. COVID-19  - nirmatrelvir/ritonavir EUA (PAXLOVID) 20 x 150 MG & 10 x 100MG  TABS; Take 3 tablets by mouth 2 (two) times daily for 5 days. (Take nirmatrelvir 150 mg two tablets twice daily for 5 days  and ritonavir 100 mg one tablet twice daily for 5 days) Patient GFR is 65  Dispense: 30 tablet; Refill: 0 - predniSONE (DELTASONE) 10 MG tablet; 6 tablets for 1 day, then 5 for 1 day, then 4 for 1 day, then 3 for 1 day, then 2 for 1 day then 1 for 1 day.  Dispense: 21 tablet; Refill: 0  2. Acute cough  - HYDROcodone bit-homatropine (HYCODAN) 5-1.5 MG/5ML syrup; Take 5 mLs by mouth every 8 (eight) hours as needed for cough.   Dispense: 120 mL; Refill: 0 - predniSONE (DELTASONE) 10 MG tablet; 6 tablets for 1 day, then 5 for 1 day, then 4 for 1 day, then 3 for 1 day, then 2 for 1 day then 1 for 1 day.  Dispense: 21 tablet; Refill: 0   Continue using albuterol inhaler prn.   Call if symptoms change or if not rapidly improving.       I discussed the assessment and treatment plan with the patient. The patient was provided an opportunity to ask questions and all were answered. The patient agreed with the plan and demonstrated an understanding of the instructions.   The patient was advised to call back or seek an in-person evaluation if the symptoms worsen or if the condition fails to improve as anticipated.  I provided 10 minutes of non-face-to-face time during this encounter.  The entirety of the information documented in the History of Present Illness, Review of Systems and Physical Exam were personally obtained by me. Portions of this information were initially documented by the CMA and reviewed by me for thoroughness and accuracy.    Lelon Huh, MD [facility] [phone] (phone) [phone] (fax)  [place]

## 2021-05-05 ENCOUNTER — Ambulatory Visit: Payer: Managed Care, Other (non HMO) | Admitting: Family Medicine

## 2021-08-10 ENCOUNTER — Ambulatory Visit (LOCAL_COMMUNITY_HEALTH_CENTER): Payer: Managed Care, Other (non HMO)

## 2021-08-10 DIAGNOSIS — Z23 Encounter for immunization: Secondary | ICD-10-CM

## 2021-08-10 DIAGNOSIS — Z719 Counseling, unspecified: Secondary | ICD-10-CM

## 2021-08-10 NOTE — Progress Notes (Signed)
  Are you feeling sick today? No   Have you ever received a dose of COVID-19 Vaccine? [employer], [place], [place], [place], Other) Yes  If yes, which vaccine and how many doses?   [place] 3   Did you bring the vaccination record card or other documentation?  Yes   Do you have a health condition or are undergoing treatment that makes you moderately or severely immunocompromised? This would include, but not be limited to: cancer, HIV, organ transplant, immunosuppressive therapy/high-dose corticosteroids, or moderate/severe primary immunodeficiency.  No  Have you received COVID-19 vaccine before or during hematopoietic cell transplant (HCT) or CAR-T-cell therapies? No  Have you ever had an allergic reaction to: (This would include a severe allergic reaction or a reaction that caused hives, swelling, or respiratory distress, including wheezing.) A component of a COVID-19 vaccine or a previous dose of COVID-19 vaccine? No   Have you ever had an allergic reaction to another vaccine (other thanCOVID-19 vaccine) or an injectable medication? (This would include a severe allergic reaction or a reaction that caused hives, swelling, or respiratory distress, including wheezing.)   No    Do you have a history of any of the following:  Myocarditis or Pericarditis no  Dermal fillers:  No  Multisystem Inflammatory Syndrome (MIS-C or MIS-A)? No  COVID-19 disease within the past 3 months? No  Vaccinated with monkeypox vaccine in the last 4 weeks? No   To nurse clinic requesting [place] [facility] booster.  Administered IM in left deltoid; tolerated well.   [facility] updated and copy to pt; updated covid vaccine card. Tonny Branch, RN

## 2021-09-01 ENCOUNTER — Ambulatory Visit (INDEPENDENT_AMBULATORY_CARE_PROVIDER_SITE_OTHER): Payer: Managed Care, Other (non HMO) | Admitting: Family Medicine

## 2021-09-01 DIAGNOSIS — Z23 Encounter for immunization: Secondary | ICD-10-CM

## 2021-09-01 NOTE — Progress Notes (Signed)
Nurse Visit: Patient here for second shingles vaccine

## 2021-09-27 ENCOUNTER — Ambulatory Visit: Payer: Self-pay

## 2021-09-27 NOTE — Telephone Encounter (Signed)
  Chief Complaint: abdominal pain  Symptoms: abdominal pain in R upper quad Frequency: 2 days  Pertinent Negatives: Patient denies vomiting diarrhea or nausea Disposition: '[]'$ ED /'[]'$ Urgent Care (no appt availability in office) / '[x]'$ Appointment(In office/virtual)/ '[]'$  [place] [facility]/ '[]'$ Home Care/ '[]'$ Refused Recommended Disposition /'[]'$  Mobile Bus/ '[]'$  Follow-up with PCP Additional Notes: scheduled pt for appt on 09/29/21 at 1100. Had offered appt for tomorrow but pt said she had to work. Advised her if pain comes back severe as was to go to ED for eval. Pt verbalized understanding.   Reason for Disposition  [1] MODERATE pain (e.g., interferes with normal activities) AND [2] comes and goes (cramps) AND [3] present > 24 hours  (Exception: pain with Vomiting or Diarrhea - see that Guideline)  Answer Assessment - Initial Assessment Questions 1. LOCATION: "Where does it hurt?"      R upper abdomen  3. ONSET: "When did the pain begin?" (e.g., minutes, hours or days ago)      2 days  5. PATTERN "Does the pain come and go, or is it constant?"    - If constant: "Is it getting better, staying the same, or worsening?"      (Note: Constant means the pain never goes away completely; most serious pain is constant and it progresses)     - If intermittent: "How long does it last?" "Do you have pain now?"     (Note: Intermittent means the pain goes away completely between bouts)     Comes and goes 6. SEVERITY: "How bad is the pain?"  (e.g., Scale 1-10; mild, moderate, or severe)    - MILD (1-3): doesn't interfere with normal activities, abdomen soft and not tender to touch     - MODERATE (4-7): interferes with normal activities or awakens from sleep, abdomen tender to touch     - SEVERE (8-10): excruciating pain, doubled over, unable to do any normal activities       11, pain not present 10. OTHER SYMPTOMS: "Do you have any other symptoms?" (e.g., back pain, diarrhea, fever, urination pain,  vomiting)       Constipation, LBM 3 days, heartburn  Protocols used: Abdominal Pain - Upper-A-AH

## 2021-09-29 ENCOUNTER — Encounter: Payer: Self-pay | Admitting: Physician Assistant

## 2021-09-29 ENCOUNTER — Ambulatory Visit (INDEPENDENT_AMBULATORY_CARE_PROVIDER_SITE_OTHER): Payer: Managed Care, Other (non HMO) | Admitting: Family Medicine

## 2021-09-29 ENCOUNTER — Ambulatory Visit (INDEPENDENT_AMBULATORY_CARE_PROVIDER_SITE_OTHER): Payer: Managed Care, Other (non HMO) | Admitting: Physician Assistant

## 2021-09-29 VITALS — BP 133/69 | HR 79 | Temp 98.3°F | Resp 16 | Wt 229.0 lb

## 2021-09-29 DIAGNOSIS — R109 Unspecified abdominal pain: Secondary | ICD-10-CM | POA: Insufficient documentation

## 2021-09-29 NOTE — Progress Notes (Deleted)
      Established patient visit   Patient: Grace [name]   DOB: 1960-07-31   61 y.o. Female  MRN: [id number] Visit Date: 09/29/2021  Today's healthcare provider: Gwyneth Sprout, FNP   I,Yosiel Thieme J Alyshia Kernan,acting as a scribe for Gwyneth Sprout, FNP.,have documented all relevant documentation on the behalf of Gwyneth Sprout, FNP,as directed by  Gwyneth Sprout, FNP while in the presence of Gwyneth Sprout, FNP.   No chief complaint on file.  Subjective    HPI  Abdominal Pain  She reports {chronicity:119221} abdominal pain. The most recent episode started {onset initial:119223} and is {progression:119226}. The abdominal pain is located in the {location:119227}{radiating to:119228}. It is described as {quality of pain (abdominal):618}, is {severity pain:119268} in intensity, occurring {frequency of symptoms:119294}. It is aggravated by {aggravated QI:297989} and is relieved by {relieved QJ:194174}. She has tried {treatments tried:119302}{improvement:119303}.  Associated symptoms: {Yes/No:20286} anorexia  {Yes/No:20286} belching  {Yes/No:20286} bloody stool {Yes/No:20286} blood in urine   {Yes/No:20286} constipation {Yes/No:20286} diarrhea  {Yes/No:20286} dysuria {Yes/No:20286} fever  {Yes/No:20286} flatus {Yes/No:20286} headaches  {Yes/No:20286} headaches {Yes/No:20286} joint pains  {Yes/No:20286} myalgias {Yes/No:20286} nausea  {Yes/No:20286} vomiting {Yes/No:20286} weight loss     Recent GI studies:{GI studies:119307} {Relevant medical history includes:119311:::1}  Previous labs Lab Results  Component Value Date   WBC 6.2 11/18/2020   HGB 11.9 11/18/2020   HCT 36.7 11/18/2020   MCV 81 11/18/2020   MCH 26.3 (L) 11/18/2020   RDW 12.7 11/18/2020   PLT 278 11/18/2020   Lab Results  Component Value Date   GLUCOSE 93 11/18/2020   NA 141 11/18/2020   K 3.9 11/18/2020   CL 103 11/18/2020   CO2 22 11/18/2020   BUN 14 11/18/2020   CREATININE 1.00 11/18/2020   GFRNONAA >60  06/25/2016   GFRAA >60 06/25/2016   CALCIUM 9.5 11/18/2020   PROT 7.1 11/18/2020   ALBUMIN 4.3 11/18/2020   LABGLOB 2.8 11/18/2020   AGRATIO 1.5 11/18/2020   BILITOT <0.2 11/18/2020   ALKPHOS 94 11/18/2020   AST 15 11/18/2020   ALT 16 11/18/2020   ANIONGAP 4 (L) 06/25/2016   No results found for: "AMYLASE" -----------------------------------------------------------------------------------------   Medications: Outpatient Medications Prior to Visit  Medication Sig   albuterol (VENTOLIN HFA) 108 (90 Base) MCG/ACT inhaler TAKE 2 PUFFS BY MOUTH EVERY 6 HOURS AS NEEDED FOR WHEEZE OR SHORTNESS OF BREATH   cetirizine (ZYRTEC) 10 MG tablet Take 1 tablet (10 mg total) by mouth daily.   fluticasone (FLONASE) 50 MCG/ACT nasal spray Place 2 sprays into both nostrils daily.   hydrochlorothiazide (HYDRODIURIL) 25 MG tablet Take 1 tablet (25 mg total) by mouth daily.   HYDROcodone bit-homatropine (HYCODAN) 5-1.5 MG/5ML syrup Take 5 mLs by mouth every 8 (eight) hours as needed for cough.   lisinopril (ZESTRIL) 10 MG tablet Take 1 tablet by mouth daily.   No facility-administered medications prior to visit.    Review of Systems  {Labs  Heme  Chem  Endocrine  Serology  Results Review (optional):23779}   Objective    There were no vitals taken for this visit. {Show previous vital signs (optional):23777}  Physical Exam  ***  No results found for any visits on 09/29/21.  Assessment & Plan     ***  No follow-ups on file.      {provider attestation***:1}   Gwyneth Sprout, [place] [phone] (phone) [phone] (fax)  [place]

## 2021-09-29 NOTE — Progress Notes (Signed)
I,Sulibeya S Dimas,acting as a Education administrator for [employer], PA-C.,have documented all relevant documentation on the behalf of Mikey Kirschner, PA-C,as directed by  Mikey Kirschner, PA-C while in the presence of Mikey Kirschner, PA-C.   Established patient visit   Patient: Grace [name]   DOB: 06-24-1960   61 y.o. Female  MRN: [id number] Visit Date: 09/29/2021  Today's healthcare provider: Mikey Kirschner, PA-C   Chief Complaint  Patient presents with   Abdominal Pain   Edema   Subjective    Abdominal Pain This is a new problem. The current episode started in the past 7 days. The problem has been waxing and waning. The pain is located in the RLQ and RUQ. The pain is moderate. The quality of the pain is aching, colicky and cramping. The abdominal pain does not radiate. Associated symptoms include constipation, myalgias and nausea. Pertinent negatives include no anorexia, belching, diarrhea, dysuria, fever, flatus, frequency, hematuria or vomiting. The pain is aggravated by eating. The pain is relieved by Being still. Treatments tried: dulcolax. The treatment provided mild relief.    Patient C/O worsening edema on legs and feet. Patient reports swelling is present since morning and worse at the end of the day. Patient reports taking HCTZ daily. Patient reports BP at home has been stable.   She reports taking ducolax for constipation which helped. Cramping was present before taking ducolax.   Medications: Outpatient Medications Prior to Visit  Medication Sig   albuterol (VENTOLIN HFA) 108 (90 Base) MCG/ACT inhaler TAKE 2 PUFFS BY MOUTH EVERY 6 HOURS AS NEEDED FOR WHEEZE OR SHORTNESS OF BREATH   fluticasone (FLONASE) 50 MCG/ACT nasal spray Place 2 sprays into both nostrils daily.   hydrochlorothiazide (HYDRODIURIL) 25 MG tablet Take 1 tablet (25 mg total) by mouth daily.   lisinopril (ZESTRIL) 10 MG tablet Take 1 tablet by mouth daily.   [DISCONTINUED] cetirizine (ZYRTEC) 10 MG tablet  Take 1 tablet (10 mg total) by mouth daily.   [DISCONTINUED] HYDROcodone bit-homatropine (HYCODAN) 5-1.5 MG/5ML syrup Take 5 mLs by mouth every 8 (eight) hours as needed for cough.   No facility-administered medications prior to visit.    Review of Systems  Constitutional:  Positive for diaphoresis. Negative for chills and fever.  Respiratory:  Positive for chest tightness. Negative for cough and shortness of breath.   Cardiovascular:  Positive for leg swelling.  Gastrointestinal:  Positive for abdominal distention, abdominal pain, constipation and nausea. Negative for anorexia, blood in stool, diarrhea, flatus and vomiting.  Genitourinary:  Negative for dysuria, frequency and hematuria.  Musculoskeletal:  Positive for myalgias.    Last CBC Lab Results  Component Value Date   WBC 6.2 11/18/2020   HGB 11.9 11/18/2020   HCT 36.7 11/18/2020   MCV 81 11/18/2020   MCH 26.3 (L) 11/18/2020   RDW 12.7 11/18/2020   PLT 278 89/16/9450   Last metabolic panel Lab Results  Component Value Date   GLUCOSE 93 11/18/2020   NA 141 11/18/2020   K 3.9 11/18/2020   CL 103 11/18/2020   CO2 22 11/18/2020   BUN 14 11/18/2020   CREATININE 1.00 11/18/2020   EGFR 65 11/18/2020   CALCIUM 9.5 11/18/2020   PROT 7.1 11/18/2020   ALBUMIN 4.3 11/18/2020   LABGLOB 2.8 11/18/2020   AGRATIO 1.5 11/18/2020   BILITOT <0.2 11/18/2020   ALKPHOS 94 11/18/2020   AST 15 11/18/2020   ALT 16 11/18/2020   ANIONGAP 4 (L) 06/25/2016  Objective    BP 133/69 (BP Location: Right Arm, Patient Position: Sitting, Cuff Size: Large)   Pulse 79   Temp 98.3 F (36.8 C) (Oral)   Resp 16   Wt 229 lb (103.9 kg)   SpO2 99%   BMI 35.87 kg/m  BP Readings from Last 3 Encounters:  09/29/21 133/69  03/03/21 (!) 142/66  12/23/20 117/77   Wt Readings from Last 3 Encounters:  09/29/21 229 lb (103.9 kg)  03/03/21 228 lb (103.4 kg)  12/23/20 213 lb (96.6 kg)  Physical Exam Constitutional:      Appearance: She is  well-developed. She is not ill-appearing.  Cardiovascular:     Rate and Rhythm: Normal rate and regular rhythm.  Pulmonary:     Effort: Pulmonary effort is normal.  Abdominal:     General: Abdomen is flat. There is no distension.     Tenderness: There is abdominal tenderness in the right upper quadrant, right lower quadrant and periumbilical area. There is no rebound. Negative signs include Murphy's sign and McBurney's sign.     Hernia: No hernia is present.    Problem List Items Addressed This Visit       Other   Abdominal pain - Primary    Despite RLQ tenderness low suspicion for acute appendicitis.  GI etiology gallbladder vs possible incisional hernia ? rx abdominal ultrasound Cmp, cbc, lipase Can continue prn ducolax, increase fluids ED precautions if pain increasees      Relevant Orders   CBC w/Diff/Platelet   Comprehensive Metabolic Panel (CMET)   Lipase   US Abdomen Complete     Return if symptoms worsen or fail to improve.      I, Mikey Kirschner, PA-C have reviewed all documentation for this visit. The documentation on  09/29/2021 for the exam, diagnosis, procedures, and orders are all accurate and complete. Mikey Kirschner, PA-C [facility] [address] [place], [place], [address] Office: [phone] Fax: [place]

## 2021-09-29 NOTE — Assessment & Plan Note (Addendum)
Despite RLQ tenderness low suspicion for acute appendicitis.  GI etiology gallbladder vs possible incisional hernia ? rx abdominal ultrasound Cmp, cbc, lipase Can continue prn ducolax, increase fluids ED precautions if pain increasees

## 2021-09-30 LAB — COMPREHENSIVE METABOLIC PANEL
ALT: 12 IU/L (ref 0–32)
AST: 11 IU/L (ref 0–40)
Albumin/Globulin Ratio: 1.6 (ref 1.2–2.2)
Albumin: 4.5 g/dL (ref 3.8–4.9)
Alkaline Phosphatase: 88 IU/L (ref 44–121)
BUN/Creatinine Ratio: 18 (ref 9–23)
BUN: 18 mg/dL (ref 6–24)
Bilirubin Total: 0.3 mg/dL (ref 0.0–1.2)
CO2: 25 mmol/L (ref 20–29)
Calcium: 9.9 mg/dL (ref 8.7–10.2)
Chloride: 102 mmol/L (ref 96–106)
Creatinine, Ser: 1.01 mg/dL — ABNORMAL HIGH (ref 0.57–1.00)
Globulin, Total: 2.8 g/dL (ref 1.5–4.5)
Glucose: 111 mg/dL — ABNORMAL HIGH (ref 70–99)
Potassium: 4.5 mmol/L (ref 3.5–5.2)
Sodium: 141 mmol/L (ref 134–144)
Total Protein: 7.3 g/dL (ref 6.0–8.5)
eGFR: 64 mL/min/{1.73_m2} (ref >59)

## 2021-09-30 LAB — CBC WITH DIFFERENTIAL/PLATELET
Basophils Absolute: 0 10*3/uL (ref 0.0–0.2)
Basos: 1 %
EOS (ABSOLUTE): 0.1 10*3/uL (ref 0.0–0.4)
Eos: 2 %
Hematocrit: 39.1 % (ref 34.0–46.6)
Hemoglobin: 12.6 g/dL (ref 11.1–15.9)
Immature Grans (Abs): 0 10*3/uL (ref 0.0–0.1)
Immature Granulocytes: 0 %
Lymphocytes Absolute: 1.9 10*3/uL (ref 0.7–3.1)
Lymphs: 38 %
MCH: 26.3 pg — ABNORMAL LOW (ref 26.6–33.0)
MCHC: 32.2 g/dL (ref 31.5–35.7)
MCV: 82 fL (ref 79–97)
Monocytes Absolute: 0.5 10*3/uL (ref 0.1–0.9)
Monocytes: 10 %
Neutrophils Absolute: 2.5 10*3/uL (ref 1.4–7.0)
Neutrophils: 49 %
Platelets: 324 10*3/uL (ref 150–450)
RBC: 4.79 x10E6/uL (ref 3.77–5.28)
RDW: 12.5 % (ref 11.7–15.4)
WBC: 5.1 10*3/uL (ref 3.4–10.8)

## 2021-09-30 LAB — LIPASE: Lipase: 32 U/L (ref 14–72)

## 2021-10-09 ENCOUNTER — Ambulatory Visit
Admission: RE | Admit: 2021-10-09 | Discharge: 2021-10-09 | Disposition: A | Discharge status: Home / Self Care | Payer: Managed Care, Other (non HMO) | Source: Ambulatory Visit | Attending: Physician Assistant | Admitting: Physician Assistant

## 2021-10-09 DIAGNOSIS — R109 Unspecified abdominal pain: Secondary | ICD-10-CM | POA: Insufficient documentation

## 2021-11-30 ENCOUNTER — Other Ambulatory Visit: Payer: Self-pay | Admitting: Family Medicine

## 2022-04-06 ENCOUNTER — Other Ambulatory Visit: Payer: Self-pay | Admitting: Physician Assistant

## 2022-04-06 ENCOUNTER — Ambulatory Visit
Admission: RE | Admit: 2022-04-06 | Discharge: 2022-04-06 | Disposition: A | Discharge status: Home / Self Care | Payer: Managed Care, Other (non HMO) | Source: Ambulatory Visit | Attending: Physician Assistant | Admitting: Physician Assistant

## 2022-04-06 DIAGNOSIS — Z1231 Encounter for screening mammogram for malignant neoplasm of breast: Secondary | ICD-10-CM

## 2022-05-08 ENCOUNTER — Encounter: Payer: Self-pay | Admitting: Family Medicine

## 2022-05-08 ENCOUNTER — Ambulatory Visit (INDEPENDENT_AMBULATORY_CARE_PROVIDER_SITE_OTHER): Payer: Managed Care, Other (non HMO) | Admitting: Family Medicine

## 2022-05-08 VITALS — BP 140/90 | HR 87 | Temp 99.9°F | Ht 67.0 in | Wt 234.1 lb

## 2022-05-08 DIAGNOSIS — J0141 Acute recurrent pansinusitis: Secondary | ICD-10-CM

## 2022-05-08 DIAGNOSIS — N941 Unspecified dyspareunia: Secondary | ICD-10-CM | POA: Insufficient documentation

## 2022-05-08 DIAGNOSIS — R7303 Prediabetes: Secondary | ICD-10-CM | POA: Diagnosis not present

## 2022-05-08 DIAGNOSIS — R7989 Other specified abnormal findings of blood chemistry: Secondary | ICD-10-CM | POA: Insufficient documentation

## 2022-05-08 DIAGNOSIS — J40 Bronchitis, not specified as acute or chronic: Secondary | ICD-10-CM | POA: Insufficient documentation

## 2022-05-08 DIAGNOSIS — R0609 Other forms of dyspnea: Secondary | ICD-10-CM | POA: Diagnosis not present

## 2022-05-08 DIAGNOSIS — Z6836 Body mass index (BMI) 36.0-36.9, adult: Secondary | ICD-10-CM

## 2022-05-08 DIAGNOSIS — I10 Essential (primary) hypertension: Secondary | ICD-10-CM

## 2022-05-08 MED ORDER — LISINOPRIL-HYDROCHLOROTHIAZIDE 20-25 MG PO TABS: 1.0000 | ORAL_TABLET | Freq: Every day | ORAL | 1 refills | Status: DC

## 2022-05-08 MED ORDER — GUAIFENESIN 400 MG PO TABS: 400.0000 mg | ORAL_TABLET | ORAL | 0 refills | Status: DC | PRN

## 2022-05-08 MED ORDER — AMOXICILLIN-POT CLAVULANATE 875-125 MG PO TABS: 1.0000 | ORAL_TABLET | Freq: Two times a day (BID) | ORAL | 0 refills | Status: DC

## 2022-05-08 MED ORDER — ALBUTEROL SULFATE HFA 108 (90 BASE) MCG/ACT IN AERS: INHALATION_SPRAY | RESPIRATORY_TRACT | 2 refills | Status: DC

## 2022-05-08 MED ORDER — PREDNISONE 50 MG PO TABS: 50.0000 mg | ORAL_TABLET | Freq: Every day | ORAL | 0 refills | Status: DC

## 2022-05-08 MED ORDER — AZELASTINE HCL 0.1 % NA SOLN: 2.0000 | Freq: Two times a day (BID) | NASAL | 12 refills | Status: DC

## 2022-05-08 MED ORDER — BENZONATATE 200 MG PO CAPS: 200.0000 mg | ORAL_CAPSULE | Freq: Three times a day (TID) | ORAL | 2 refills | Status: DC | PRN

## 2022-05-08 MED ORDER — HYDROCOD POLI-CHLORPHE POLI ER 10-8 MG/5ML PO SUER: 5.0000 mL | Freq: Every evening | ORAL | 0 refills | Status: DC | PRN

## 2022-05-08 NOTE — Assessment & Plan Note (Signed)
Chronic, previously stable Repeat A1c Discussed importance of healthy weight management Discussed diet and exercise

## 2022-05-08 NOTE — Assessment & Plan Note (Signed)
Chronic, previously elevated Repeat LP Goal <100 given pre-diabetes

## 2022-05-08 NOTE — Assessment & Plan Note (Signed)
Acute, recurrent Was treated with Zpak; symptoms improved but did not resolve Also noted DOE/SOB and active coughing during exam Home COVID test negative Recommend new abx with supportive therapies to assist

## 2022-05-08 NOTE — Assessment & Plan Note (Signed)
Acute on chronic, likely from recurrent viral now bacterial symptoms with further bronchial irritation

## 2022-05-08 NOTE — Assessment & Plan Note (Signed)
Acute, in setting of recurrent sinusitis Recommend PO steroid with inhaler to assist Close f/u recommended given elevated RR and BP

## 2022-05-08 NOTE — Assessment & Plan Note (Signed)
Chronic, stable Body mass index is 36.67 kg/m. Continue to recommend balanced, lower carb meals. Smaller meal size, adding snacks. Choosing water as drink of choice and increasing purposeful exercise.

## 2022-05-08 NOTE — Progress Notes (Signed)
Established patient visit  Patient: Grace [name]   DOB: October 10, 1960   62 y.o. Female  MRN: [id number] Visit Date: 05/08/2022  Today's healthcare provider: Gwyneth Sprout, FNP  Introduced to nurse practitioner role and practice setting.  All questions answered.  Discussed provider/patient relationship and expectations.  Subjective    UPPER RESPIRATORY TRACT INFECTION Worst symptom: Fever: no Cough: yes Shortness of breath: yes Wheezing: no Chest pain: yes, with cough Chest tightness: yes Chest congestion: yes Nasal congestion: yes Runny nose: yes Post nasal drip: yes Sneezing: no Sore throat: no Swollen glands: yes Sinus pressure: yes Headache: no Face pain: yes Toothache: no Ear pain: yes bilateral Ear pressure: no  Eyes red/itching:no Eye drainage/crusting: yes  Vomiting: no Rash: no Fatigue: yes Sick contacts: no Strep contacts: no  Context: fluctuating Recurrent sinusitis: no Relief with OTC cold/cough medications: no  Treatments attempted: antibiotics- got a Zpak from another provider; got some relief but not improved 100%     Medications: Outpatient Medications Prior to Visit  Medication Sig   [DISCONTINUED] albuterol (VENTOLIN HFA) 108 (90 Base) MCG/ACT inhaler TAKE 2 PUFFS BY MOUTH EVERY 6 HOURS AS NEEDED FOR WHEEZE OR SHORTNESS OF BREATH   [DISCONTINUED] lisinopril (ZESTRIL) 10 MG tablet Take 1 tablet by mouth daily.   fluticasone (FLONASE) 50 MCG/ACT nasal spray Place 2 sprays into both nostrils daily. (Patient not taking: Reported on 05/08/2022)   [DISCONTINUED] hydrochlorothiazide (HYDRODIURIL) 25 MG tablet Take 1 tablet (25 mg total) by mouth daily. (Patient not taking: Reported on 05/08/2022)   No facility-administered medications prior to visit.    Review of Systems  Last CBC Lab Results  Component Value Date   WBC 5.1 09/29/2021   HGB 12.6 09/29/2021   HCT 39.1 09/29/2021   MCV 82 09/29/2021   MCH 26.3 (L) 09/29/2021   RDW 12.5  09/29/2021   PLT 324 [id number]   Last metabolic panel Lab Results  Component Value Date   GLUCOSE 111 (H) 09/29/2021   NA 141 09/29/2021   K 4.5 09/29/2021   CL 102 09/29/2021   CO2 25 09/29/2021   BUN 18 09/29/2021   CREATININE 1.01 (H) 09/29/2021   EGFR 64 09/29/2021   CALCIUM 9.9 09/29/2021   PROT 7.3 09/29/2021   ALBUMIN 4.5 09/29/2021   LABGLOB 2.8 09/29/2021   AGRATIO 1.6 09/29/2021   BILITOT 0.3 09/29/2021   ALKPHOS 88 09/29/2021   AST 11 09/29/2021   ALT 12 09/29/2021   ANIONGAP 4 (L) 06/25/2016   Last lipids Lab Results  Component Value Date   CHOL 209 (H) 11/18/2020   HDL 39 (L) 11/18/2020   LDLCALC 143 (H) 11/18/2020   TRIG 147 11/18/2020   CHOLHDL 5.4 (H) 11/18/2020   Last hemoglobin A1c Lab Results  Component Value Date   HGBA1C 6.4 (H) 11/18/2020   Last thyroid functions Lab Results  Component Value Date   TSH 0.948 11/18/2020       Objective    BP (!) 140/90 Comment: home  Pulse 87   Temp 99.9 F (37.7 C)   Ht 5' 7"$  (1.702 m)   Wt 234 lb 1.6 oz (106.2 kg)   SpO2 99%   BMI 36.67 kg/m   BP Readings from Last 3 Encounters:  05/08/22 (!) 140/90  09/29/21 133/69  03/03/21 (!) 142/66   Wt Readings from Last 3 Encounters:  05/08/22 234 lb 1.6 oz (106.2 kg)  09/29/21 229 lb (103.9 kg)  03/03/21 228 lb (103.4 kg)  SpO2 Readings from Last 3 Encounters:  05/08/22 99%  09/29/21 99%  03/03/21 99%   Physical Exam Vitals and nursing note reviewed.  Constitutional:      General: She is not in acute distress.    Appearance: Normal appearance. She is obese. She is not ill-appearing, toxic-appearing or diaphoretic.  HENT:     Head: Normocephalic and atraumatic.     Right Ear: Tympanic membrane, ear canal and external ear normal. There is impacted cerumen.     Left Ear: Tympanic membrane, ear canal and external ear normal. There is impacted cerumen.     Nose: Congestion and rhinorrhea present.     Mouth/Throat:     Mouth: Mucous  membranes are moist.     Pharynx: Oropharynx is clear. No oropharyngeal exudate or posterior oropharyngeal erythema.  Eyes:     Extraocular Movements: Extraocular movements intact.     Conjunctiva/sclera: Conjunctivae normal.     Pupils: Pupils are equal, round, and reactive to light.     Comments: Tearing from coughing fits  Cardiovascular:     Rate and Rhythm: Normal rate and regular rhythm.     Pulses: Normal pulses.     Heart sounds: Normal heart sounds. No murmur heard.    No friction rub. No gallop.  Pulmonary:     Effort: Pulmonary effort is normal. Tachypnea present. No respiratory distress.     Breath sounds: No stridor. Examination of the right-upper field reveals decreased breath sounds and wheezing. Examination of the left-upper field reveals decreased breath sounds and wheezing. Examination of the right-middle field reveals decreased breath sounds and wheezing. Examination of the left-middle field reveals decreased breath sounds and wheezing. Examination of the right-lower field reveals decreased breath sounds. Examination of the left-lower field reveals decreased breath sounds. Decreased breath sounds and wheezing present. No rhonchi or rales.  Chest:     Chest wall: Tenderness present.  Musculoskeletal:        General: No swelling, tenderness, deformity or signs of injury. Normal range of motion.     Right lower leg: No edema.     Left lower leg: No edema.  Skin:    General: Skin is warm and dry.     Capillary Refill: Capillary refill takes less than 2 seconds.     Coloration: Skin is not jaundiced or pale.     Findings: No bruising, erythema, lesion or rash.  Neurological:     General: No focal deficit present.     Mental Status: She is alert and oriented to person, place, and time. Mental status is at baseline.     Cranial Nerves: No cranial nerve deficit.     Sensory: No sensory deficit.     Motor: No weakness.     Coordination: Coordination normal.  Psychiatric:         Mood and Affect: Mood normal.        Behavior: Behavior normal.        Thought Content: Thought content normal.        Judgment: Judgment normal.     No results found for any visits on 05/08/22.  Assessment & Plan     Problem List Items Addressed This Visit       Cardiovascular and Mediastinum   Primary hypertension    Chronic, uncontrolled BP medications are not "as needed" and given your elevation require continued efforts of weight management, diet and exercise modifications BP goal of 130/80 given pre-diabetes New Rx provided; 1 month f/u encouraged  Relevant Medications   lisinopril-hydrochlorothiazide (ZESTORETIC) 20-25 MG tablet   Other Relevant Orders   CBC with Differential/Platelet   Comprehensive Metabolic Panel (CMET)     Respiratory   Acute recurrent pansinusitis - Primary    Acute, recurrent Was treated with Zpak; symptoms improved but did not resolve Also noted DOE/SOB and active coughing during exam Home COVID test negative Recommend new abx with supportive therapies to assist       Relevant Medications   predniSONE (DELTASONE) 50 MG tablet   amoxicillin-clavulanate (AUGMENTIN) 875-125 MG tablet   benzonatate (TESSALON) 200 MG capsule   chlorpheniramine-HYDROcodone (TUSSIONEX) 10-8 MG/5ML   guaifenesin (HUMIBID E) 400 MG TABS tablet   azelastine (ASTELIN) 0.1 % nasal spray   Bronchitis    Acute, in setting of recurrent sinusitis Recommend PO steroid with inhaler to assist Close f/u recommended given elevated RR and BP      Relevant Medications   albuterol (VENTOLIN HFA) 108 (90 Base) MCG/ACT inhaler   predniSONE (DELTASONE) 50 MG tablet   benzonatate (TESSALON) 200 MG capsule   chlorpheniramine-HYDROcodone (TUSSIONEX) 10-8 MG/5ML   guaifenesin (HUMIBID E) 400 MG TABS tablet     Other   DOE (dyspnea on exertion)    Acute on chronic, likely from recurrent viral now bacterial symptoms with further bronchial irritation       Relevant  Medications   albuterol (VENTOLIN HFA) 108 (90 Base) MCG/ACT inhaler   predniSONE (DELTASONE) 50 MG tablet   benzonatate (TESSALON) 200 MG capsule   chlorpheniramine-HYDROcodone (TUSSIONEX) 10-8 MG/5ML   guaifenesin (HUMIBID E) 400 MG TABS tablet   Dyspareunia in female   Relevant Orders   Ambulatory referral to Gynecology   High serum low-density lipoprotein (LDL)    Chronic, previously elevated Repeat LP Goal <100 given pre-diabetes       Relevant Orders   Lipid panel   Morbid obesity (HCC)    Chronic, stable Body mass index is 36.67 kg/m. Continue to recommend balanced, lower carb meals. Smaller meal size, adding snacks. Choosing water as drink of choice and increasing purposeful exercise.       Relevant Orders   CBC with Differential/Platelet   Comprehensive Metabolic Panel (CMET)   Hemoglobin A1c   Lipid panel   Prediabetes    Chronic, previously stable Repeat A1c Discussed importance of healthy weight management Discussed diet and exercise       Relevant Orders   Hemoglobin A1c   Return in about 4 weeks (around 06/05/2022) for blood pressure check, HTN management.     Vonna Kotyk, FNP, have reviewed all documentation for this visit. The documentation on 05/08/22 for the exam, diagnosis, procedures, and orders are all accurate and complete.  Gwyneth Sprout, [place] [phone] (phone) [phone] (fax)  [place]

## 2022-05-08 NOTE — Assessment & Plan Note (Signed)
Chronic, uncontrolled BP medications are not "as needed" and given your elevation require continued efforts of weight management, diet and exercise modifications BP goal of 130/80 given pre-diabetes New Rx provided; 1 month f/u encouraged

## 2022-05-09 ENCOUNTER — Other Ambulatory Visit: Payer: Self-pay | Admitting: Family Medicine

## 2022-05-09 ENCOUNTER — Ambulatory Visit: Payer: Self-pay | Admitting: *Deleted

## 2022-05-09 LAB — COMPREHENSIVE METABOLIC PANEL
ALT: 18 IU/L (ref 0–32)
AST: 19 IU/L (ref 0–40)
Albumin/Globulin Ratio: 1.6 (ref 1.2–2.2)
Albumin: 4.5 g/dL (ref 3.8–4.9)
Alkaline Phosphatase: 104 IU/L (ref 44–121)
BUN/Creatinine Ratio: 9 — ABNORMAL LOW (ref 12–28)
BUN: 8 mg/dL (ref 8–27)
Bilirubin Total: 0.2 mg/dL (ref 0.0–1.2)
CO2: 23 mmol/L (ref 20–29)
Calcium: 9.7 mg/dL (ref 8.7–10.3)
Chloride: 104 mmol/L (ref 96–106)
Creatinine, Ser: 0.93 mg/dL (ref 0.57–1.00)
Globulin, Total: 2.9 g/dL (ref 1.5–4.5)
Glucose: 98 mg/dL (ref 70–99)
Potassium: 4.8 mmol/L (ref 3.5–5.2)
Sodium: 140 mmol/L (ref 134–144)
Total Protein: 7.4 g/dL (ref 6.0–8.5)
eGFR: 70 mL/min/{1.73_m2} (ref >59)

## 2022-05-09 LAB — CBC WITH DIFFERENTIAL/PLATELET
Basophils Absolute: 0 10*3/uL (ref 0.0–0.2)
Basos: 1 %
EOS (ABSOLUTE): 0.2 10*3/uL (ref 0.0–0.4)
Eos: 3 %
Hematocrit: 38.7 % (ref 34.0–46.6)
Hemoglobin: 12.3 g/dL (ref 11.1–15.9)
Immature Grans (Abs): 0 10*3/uL (ref 0.0–0.1)
Immature Granulocytes: 0 %
Lymphocytes Absolute: 2.2 10*3/uL (ref 0.7–3.1)
Lymphs: 37 %
MCH: 26.1 pg — ABNORMAL LOW (ref 26.6–33.0)
MCHC: 31.8 g/dL (ref 31.5–35.7)
MCV: 82 fL (ref 79–97)
Monocytes Absolute: 0.5 10*3/uL (ref 0.1–0.9)
Monocytes: 9 %
Neutrophils Absolute: 3 10*3/uL (ref 1.4–7.0)
Neutrophils: 50 %
Platelets: 363 10*3/uL (ref 150–450)
RBC: 4.71 x10E6/uL (ref 3.77–5.28)
RDW: 12.9 % (ref 11.7–15.4)
WBC: 5.9 10*3/uL (ref 3.4–10.8)

## 2022-05-09 LAB — LIPID PANEL
Chol/HDL Ratio: 5.7 ratio — ABNORMAL HIGH (ref 0.0–4.4)
Cholesterol, Total: 221 mg/dL — ABNORMAL HIGH (ref 100–199)
HDL: 39 mg/dL — ABNORMAL LOW (ref >39)
LDL Chol Calc (NIH): 155 mg/dL — ABNORMAL HIGH (ref 0–99)
Triglycerides: 146 mg/dL (ref 0–149)
VLDL Cholesterol Cal: 27 mg/dL (ref 5–40)

## 2022-05-09 LAB — HEMOGLOBIN A1C
Est. average glucose Bld gHb Est-mCnc: 134 mg/dL
Hgb A1c MFr Bld: 6.3 % — ABNORMAL HIGH (ref 4.8–5.6)

## 2022-05-09 MED ORDER — ROSUVASTATIN CALCIUM 20 MG PO TABS: 20.0000 mg | ORAL_TABLET | Freq: Every day | ORAL | 1 refills | Status: DC

## 2022-05-09 NOTE — Telephone Encounter (Signed)
Pt returned call and was given the message from [place] dated 05/09/2022 at 8:07 AM.  She wanted to know if Daneil Dan was going to prescribe something for the elevated cholesterol.  Reason for Disposition  [1] Follow-up call to recent contact AND [2] information only call, no triage required  Answer Assessment - Initial Assessment Questions 1. REASON FOR CALL or QUESTION: "What is your reason for calling today?" or "How can I best help you?" or "What question do you have that I can help answer?"     Pt returned call and was given her lab result message from Tally Joe, [place]  She wants to know if Daneil Dan is going to prescribe something for the elevated cholesterol.  Protocols used: Information Only Call - No Triage-A-AH

## 2022-05-09 NOTE — Telephone Encounter (Signed)
  Chief Complaint: Called in for lab results.    Symptoms:  Frequency:  Pertinent Negatives: Patient denies  Disposition: '[]'$ ED /'[]'$ Urgent Care (no appt availability in office) / '[]'$ Appointment(In office/virtual)/ '[]'$  [place] [facility]/ '[x]'$ Home Care/ '[]'$ Refused Recommended Disposition /'[]'$  Mobile Bus/ '[]'$  Follow-up with PCP Additional Notes: Message given from Tally Joe, FNP  Pt wants to know if something will be prescribed for the elevated cholesterol.

## 2022-05-09 NOTE — Progress Notes (Signed)
Despite ongoing respiratory symptoms; all labs remain normal and stable with exception of cholesterol. Cholesterol total is elevated as well as bad/LDL cholesterol. With blood pressure elevation- risk of heart attack and/or stroke is elevated at 12% in 10 years. I continue to recommend diet low in saturated fat and regular exercise - 30 min at least 5 times per week  The 10-year ASCVD risk score (Arnett DK, et al., 2019) is: 11.7%   Values used to calculate the score:     Age: 62 years     Sex: Female     Is Non-Hispanic African American: Yes     Diabetic: No     Tobacco smoker: No     Systolic Blood Pressure: XX123456 mmHg     Is BP treated: Yes     HDL Cholesterol: 39 mg/dL     Total Cholesterol: 221 mg/dL  Please let us know if you do not hear from GYN in 1-2 weeks.  Take care Grace [name], [address] [place], [place] [address] [phone] (phone) [phone] (fax) [place]

## 2022-05-30 ENCOUNTER — Other Ambulatory Visit: Payer: Self-pay | Admitting: Family Medicine

## 2022-05-30 DIAGNOSIS — I10 Essential (primary) hypertension: Secondary | ICD-10-CM

## 2022-05-31 ENCOUNTER — Other Ambulatory Visit: Payer: Self-pay | Admitting: Family Medicine

## 2022-05-31 NOTE — Telephone Encounter (Signed)
Unable to refill per protocol, Rx request is too soon. Last refill 05/09/22 for 30 and 1 refill.  Requested Prescriptions  Pending Prescriptions Disp Refills   rosuvastatin (CRESTOR) 20 MG tablet [Pharmacy Med Name: ROSUVASTATIN CALCIUM 20 MG TAB] 90 tablet 1    Sig: TAKE 1 TABLET BY MOUTH EVERY DAY     Cardiovascular:  Antilipid - Statins 2 Failed - 05/31/2022 12:31 PM      Failed - Lipid Panel in normal range within the last 12 months    Cholesterol, Total  Date Value Ref Range Status  05/08/2022 221 (H) 100 - 199 mg/dL Final   LDL Chol Calc (NIH)  Date Value Ref Range Status  05/08/2022 155 (H) 0 - 99 mg/dL Final   HDL  Date Value Ref Range Status  05/08/2022 39 (L) >39 mg/dL Final   Triglycerides  Date Value Ref Range Status  05/08/2022 146 0 - 149 mg/dL Final         Passed - Cr in normal range and within 360 days    Creatinine  Date Value Ref Range Status  03/29/2012 0.98 0.60 - 1.30 mg/dL Final   Creatinine, Ser  Date Value Ref Range Status  05/08/2022 0.93 0.57 - 1.00 mg/dL Final         Passed - Patient is not pregnant      Passed - Valid encounter within last 12 months    Recent Outpatient Visits           3 weeks ago Acute recurrent pansinusitis   [place] Tally Joe T, FNP   8 months ago Abdominal pain, unspecified abdominal location   [facility] Mikey Kirschner, PA-C   9 months ago Need for shingles vaccine   [facility] Gwyneth Sprout, FNP   1 year ago Johnston, Donald E, MD   1 year ago Primary hypertension   [place] Gwyneth Sprout, [place]       Future Appointments             Tomorrow Gwyneth Sprout, [place], [facility]

## 2022-06-01 ENCOUNTER — Encounter: Payer: Self-pay | Admitting: Family Medicine

## 2022-06-01 ENCOUNTER — Ambulatory Visit (INDEPENDENT_AMBULATORY_CARE_PROVIDER_SITE_OTHER): Payer: Managed Care, Other (non HMO) | Admitting: Family Medicine

## 2022-06-01 ENCOUNTER — Ambulatory Visit (INDEPENDENT_AMBULATORY_CARE_PROVIDER_SITE_OTHER): Payer: Managed Care, Other (non HMO) | Admitting: Licensed Practical Nurse

## 2022-06-01 ENCOUNTER — Other Ambulatory Visit (HOSPITAL_COMMUNITY)
Admission: RE | Admit: 2022-06-01 | Discharge: 2022-06-01 | Disposition: A | Discharge status: Home / Self Care | Payer: Managed Care, Other (non HMO) | Source: Ambulatory Visit | Attending: Licensed Practical Nurse | Admitting: Licensed Practical Nurse

## 2022-06-01 ENCOUNTER — Ambulatory Visit: Payer: Managed Care, Other (non HMO) | Admitting: Family Medicine

## 2022-06-01 VITALS — BP 129/65 | HR 82 | Wt 229.1 lb

## 2022-06-01 VITALS — BP 134/60 | HR 82 | Temp 98.4°F | Ht 67.0 in | Wt 233.0 lb

## 2022-06-01 DIAGNOSIS — I10 Essential (primary) hypertension: Secondary | ICD-10-CM | POA: Diagnosis not present

## 2022-06-01 DIAGNOSIS — Z01419 Encounter for gynecological examination (general) (routine) without abnormal findings: Secondary | ICD-10-CM | POA: Insufficient documentation

## 2022-06-01 DIAGNOSIS — Z124 Encounter for screening for malignant neoplasm of cervix: Secondary | ICD-10-CM | POA: Insufficient documentation

## 2022-06-01 DIAGNOSIS — B379 Candidiasis, unspecified: Secondary | ICD-10-CM

## 2022-06-01 DIAGNOSIS — E782 Mixed hyperlipidemia: Secondary | ICD-10-CM | POA: Diagnosis not present

## 2022-06-01 DIAGNOSIS — K2100 Gastro-esophageal reflux disease with esophagitis, without bleeding: Secondary | ICD-10-CM | POA: Diagnosis not present

## 2022-06-01 DIAGNOSIS — B9689 Other specified bacterial agents as the cause of diseases classified elsewhere: Secondary | ICD-10-CM

## 2022-06-01 DIAGNOSIS — N760 Acute vaginitis: Secondary | ICD-10-CM | POA: Insufficient documentation

## 2022-06-01 MED ORDER — METRONIDAZOLE 500 MG PO TABS: 500.0000 mg | ORAL_TABLET | Freq: Two times a day (BID) | ORAL | 0 refills | Status: AC

## 2022-06-01 MED ORDER — LISINOPRIL-HYDROCHLOROTHIAZIDE 20-25 MG PO TABS: 1.0000 | ORAL_TABLET | Freq: Every day | ORAL | 3 refills | Status: DC

## 2022-06-01 MED ORDER — OMEPRAZOLE 40 MG PO CPDR: 40.0000 mg | DELAYED_RELEASE_CAPSULE | Freq: Every day | ORAL | 3 refills | Status: DC

## 2022-06-01 MED ORDER — FLUCONAZOLE 150 MG PO TABS: 150.0000 mg | ORAL_TABLET | Freq: Once | ORAL | 0 refills | Status: AC

## 2022-06-01 MED ORDER — EZETIMIBE 10 MG PO TABS: 10.0000 mg | ORAL_TABLET | Freq: Every day | ORAL | 3 refills | Status: DC

## 2022-06-01 MED ORDER — BUPROPION HCL ER (XL) 150 MG PO TB24: 150.0000 mg | ORAL_TABLET | Freq: Every day | ORAL | 2 refills | Status: DC

## 2022-06-01 NOTE — Assessment & Plan Note (Signed)
Acute, self limiting Recommend use of flagyl at this time Seek additional care at GYN with PAP smear

## 2022-06-01 NOTE — Assessment & Plan Note (Signed)
Unable to tolerate statins d/t cramping Trial of zetia; 2 month f/u recommended

## 2022-06-01 NOTE — Assessment & Plan Note (Signed)
Chronic, stable Body mass index is 36.49 kg/m. Continue to recommend balanced, lower carb meals. Smaller meal size, adding snacks. Choosing water as drink of choice and increasing purposeful exercise.

## 2022-06-01 NOTE — Assessment & Plan Note (Signed)
Chronic, now at goal Pt endorses daily use of medications  BP goal of <140/<90 with pre-diabetes Continue zestoretic 20-25

## 2022-06-01 NOTE — Assessment & Plan Note (Signed)
Acute on chronic, worsening iso resp illness Encouraged to start prilosec 40 mg daily to assist  RTC in 2 months

## 2022-06-01 NOTE — Assessment & Plan Note (Signed)
Acute, self limiting Recommend diflucan to assist RTC as needed

## 2022-06-01 NOTE — Progress Notes (Signed)
Established patient visit  Patient: Grace [name]   DOB: [birth year]   62 y.o. Female  MRN: [id number] Visit Date: 06/01/2022  Today's healthcare provider: Gwyneth Sprout, FNP   Re Introduced to nurse practitioner role and practice setting.  All questions answered.  Discussed provider/patient relationship and expectations.  Subjective    HPI HPI   --pt stated--feeling much better and pt d/c rosuvastatin-causing stomach cramp Last edited by Elta Guadeloupe, CMA on 06/01/2022  1:04 PM.      Hypertension, follow-up  BP Readings from Last 3 Encounters:  06/01/22 134/60  05/08/22 (!) 140/90  09/29/21 133/69   Wt Readings from Last 3 Encounters:  06/01/22 233 lb (105.7 kg)  05/08/22 234 lb 1.6 oz (106.2 kg)  09/29/21 229 lb (103.9 kg)     She was last seen for hypertension 1 months ago.  BP at that visit was 140/90. Management since that visit includes start lisinopril-hydrochlorothiazide .  She reports excellent compliance with treatment. She is not having side effects.    Outside blood pressures are not being checked   Medications: Outpatient Medications Prior to Visit  Medication Sig   albuterol (VENTOLIN HFA) 108 (90 Base) MCG/ACT inhaler TAKE 2 PUFFS BY MOUTH EVERY 6 HOURS AS NEEDED FOR WHEEZE OR SHORTNESS OF BREATH   azelastine (ASTELIN) 0.1 % nasal spray Place 2 sprays into both nostrils 2 (two) times daily. Use in each nostril as directed   [DISCONTINUED] lisinopril-hydrochlorothiazide (ZESTORETIC) 20-25 MG tablet TAKE 1 TABLET BY MOUTH EVERY DAY   [DISCONTINUED] amoxicillin-clavulanate (AUGMENTIN) 875-125 MG tablet Take 1 tablet by mouth 2 (two) times daily.   [DISCONTINUED] benzonatate (TESSALON) 200 MG capsule Take 1 capsule (200 mg total) by mouth 3 (three) times daily as needed for cough. (Patient not taking: Reported on 06/01/2022)   [DISCONTINUED] chlorpheniramine-HYDROcodone (TUSSIONEX) 10-8 MG/5ML Take 5 mLs by mouth at bedtime as needed for cough.    [DISCONTINUED] fluticasone (FLONASE) 50 MCG/ACT nasal spray Place 2 sprays into both nostrils daily. (Patient not taking: Reported on 05/08/2022)   [DISCONTINUED] guaifenesin (HUMIBID E) 400 MG TABS tablet Take 1 tablet (400 mg total) by mouth every 4 (four) hours as needed.   [DISCONTINUED] predniSONE (DELTASONE) 50 MG tablet Take 1 tablet (50 mg total) by mouth daily with breakfast.   [DISCONTINUED] rosuvastatin (CRESTOR) 20 MG tablet Take 1 tablet (20 mg total) by mouth daily. (Patient not taking: Reported on 06/01/2022)   No facility-administered medications prior to visit.    Review of Systems     Objective    BP 134/60 (BP Location: Right Arm, Patient Position: Sitting, Cuff Size: Large)   Pulse 82   Temp 98.4 F (36.9 C)   Ht '5\' 7"'$  (1.702 m)   Wt 233 lb (105.7 kg)   SpO2 98%   BMI 36.49 kg/m    Physical Exam Vitals and nursing note reviewed.  Constitutional:      General: She is not in acute distress.    Appearance: Normal appearance. She is obese. She is not ill-appearing, toxic-appearing or diaphoretic.  HENT:     Head: Normocephalic and atraumatic.  Cardiovascular:     Rate and Rhythm: Normal rate and regular rhythm.     Pulses: Normal pulses.     Heart sounds: Normal heart sounds. No murmur heard.    No friction rub. No gallop.  Pulmonary:     Effort: Pulmonary effort is normal. No respiratory distress.     Breath sounds: Normal breath sounds.  No stridor. No wheezing, rhonchi or rales.  Chest:     Chest wall: No tenderness.  Musculoskeletal:        General: No swelling, tenderness, deformity or signs of injury. Normal range of motion.     Right lower leg: No edema.     Left lower leg: No edema.  Skin:    General: Skin is warm and dry.     Capillary Refill: Capillary refill takes less than 2 seconds.     Coloration: Skin is not jaundiced or pale.     Findings: No bruising, erythema, lesion or rash.  Neurological:     General: No focal deficit present.      Mental Status: She is alert and oriented to person, place, and time. Mental status is at baseline.     Cranial Nerves: No cranial nerve deficit.     Sensory: No sensory deficit.     Motor: No weakness.     Coordination: Coordination normal.  Psychiatric:        Mood and Affect: Mood normal.        Behavior: Behavior normal.        Thought Content: Thought content normal.        Judgment: Judgment normal.    No results found for any visits on 06/01/22.  Assessment & Plan     Problem List Items Addressed This Visit       Cardiovascular and Mediastinum   Primary hypertension - Primary    Chronic, now at goal Pt endorses daily use of medications  BP goal of <140/<90 with pre-diabetes Continue zestoretic 20-25       Relevant Medications   lisinopril-hydrochlorothiazide (ZESTORETIC) 20-25 MG tablet   ezetimibe (ZETIA) 10 MG tablet     Digestive   Gastroesophageal reflux disease with esophagitis without hemorrhage    Acute on chronic, worsening iso resp illness Encouraged to start prilosec 40 mg daily to assist  RTC in 2 months        Genitourinary   BV (bacterial vaginosis)    Acute, self limiting Recommend use of flagyl at this time Seek additional care at GYN with PAP smear       Relevant Medications   metroNIDAZOLE (FLAGYL) 500 MG tablet   fluconazole (DIFLUCAN) 150 MG tablet     Other   Antibiotic-induced yeast infection    Acute, self limiting Recommend diflucan to assist RTC as needed       Relevant Medications   metroNIDAZOLE (FLAGYL) 500 MG tablet   fluconazole (DIFLUCAN) 150 MG tablet   Mixed hyperlipidemia    Unable to tolerate statins d/t cramping Trial of zetia; 2 month f/u recommended       Relevant Medications   lisinopril-hydrochlorothiazide (ZESTORETIC) 20-25 MG tablet   ezetimibe (ZETIA) 10 MG tablet   Morbid obesity (HCC)    Chronic, stable Body mass index is 36.49 kg/m. Continue to recommend balanced, lower carb meals. Smaller meal  size, adding snacks. Choosing water as drink of choice and increasing purposeful exercise.       Return in about 2 months (around 08/01/2022).     Vonna Kotyk, FNP, have reviewed all documentation for this visit. The documentation on 06/01/22 for the exam, diagnosis, procedures, and orders are all accurate and complete.  Gwyneth Sprout, [place] [phone] (phone) [phone] (fax)  [place]

## 2022-06-01 NOTE — Progress Notes (Signed)
Gynecology Annual Exam  PCP: Gwyneth Sprout, FNP  Chief Complaint: pain in her abdomen   History of Present Illness:Patient is a 61 y.o. No obstetric history on file. presents for annual exam. The patient desires a pap smear and would like to discuss pain in her abdomen. This pain has been there for a number of years. It is in the center of her abdomen near her umbilicus and goes towards the right. The pain comes and goes. It feels like cramping. She has had c/s 2 and a hysterectomy. She has a BM every other day. She has had imaging and was told nothing was wrong.    LMP: No LMP recorded. Patient has had a hysterectomy.  The patient is not sexually active-her husband is currently being treated for cancer. . She has dyspareunia.  The patient does perform self breast exams.  There is no notable family history of breast or ovarian cancer in her family. But there is a history of other cancers in the family.   The patient wears seatbelts: yes.   The patient has regular exercise: no.    The patient denies current symptoms of depression.    Has worked as a Art therapist Lives with her husband Dental: will have braces placed in April Eyes: wears glasses last exam 2 years ago PCP; Rollene Rotunda, seen today for a URI   Review of Systems: Review of Systems  Constitutional: Negative.   HENT: Negative.    Eyes: Negative.   Respiratory:  Positive for cough.   Cardiovascular: Negative.   Gastrointestinal:  Positive for constipation.  Musculoskeletal: Negative.   Skin: Negative.   Neurological: Negative.   Endo/Heme/Allergies:        Hot flashes sometimes  Sneezing    Brownish vaginal discharge daily   Past Medical History:  Patient Active Problem List   Diagnosis Date Noted   Gastroesophageal reflux disease with esophagitis without hemorrhage 06/01/2022   Mixed hyperlipidemia 06/01/2022   BV (bacterial vaginosis) 06/01/2022   Antibiotic-induced yeast infection 06/01/2022   High serum  low-density lipoprotein (LDL) 05/08/2022   Dyspareunia in female 05/08/2022   Abdominal pain 09/29/2021   Tinnitus of both ears 03/03/2021   Class 2 severe obesity due to excess calories with serious comorbidity and body mass index (BMI) of 35.0 to 35.9 in adult [facility]) 03/03/2021   Nasal congestion 01/27/2021   Otalgia of both ears 01/27/2021   Encounter for colonoscopy in patient with family history of colon cancer 11/18/2020   Morbid obesity ([place]) 11/18/2020   Primary hypertension 11/18/2020   Preventative health care 11/18/2020   Family history of coronary artery disease 02/26/2017   Prediabetes 02/26/2017   DOE (dyspnea on exertion) 02/26/2017    Past Surgical History:  Past Surgical History:  Procedure Laterality Date   ABDOMINAL HYSTERECTOMY  2006   not due to cancer-Partial   COLONOSCOPY WITH PROPOFOL N/A 12/23/2020   Procedure: COLONOSCOPY WITH PROPOFOL;  Surgeon: Jonathon Bellows, MD;  Location: [facility] ENDOSCOPY;  Service: Gastroenterology;  Laterality: N/A;   Elbow adjustment Right 03/30/2011   had to "pop" back into place   TOTAL ABDOMINAL HYSTERECTOMY W/ BILATERAL SALPINGOOPHORECTOMY  09/07/2013   with Dr. Romelle Starcher    Gynecologic History:  No LMP recorded. Patient has had a hysterectomy. Last Pap: Results were: 2018 no abnormalities  Last mammogram: 03/2022 Results were: BI-RAD I  Obstetric History: No obstetric history on file.  Family History:  Family History  Problem Relation Age of Onset  Diabetes Mother    Hypertension Mother    Stroke Mother    Heart disease Mother    Asthma Sister    Hypertension Sister    Hypertension Sister    Breast cancer Maternal Aunt    Breast cancer Cousin     Social History:  Social History   Socioeconomic History   Marital status: Married    Spouse name: Shonna Chock   Number of children: Not on file   Years of education: Not on file   Highest education level: Not on file  Occupational History   Not on file  Tobacco Use    Smoking status: Never   Smokeless tobacco: Never  Vaping Use   Vaping Use: Never used  Substance and Sexual Activity   Alcohol use: Yes    Comment: Occasional; once a month   Drug use: No   Sexual activity: Not on file  Other Topics Concern   Not on file  Social History Narrative   Not on file   Social Determinants of Health   Financial Resource Strain: Not on file  Food Insecurity: Not on file  Transportation Needs: Not on file  Physical Activity: Not on file  Stress: Not on file  Social Connections: Not on file  Intimate Partner Violence: Not on file    Allergies:  Allergies  Allergen Reactions   Zinc Other (See Comments)    Cramping; abdominal pain   Atorvastatin Nausea And Vomiting   Nickel Rash    Medications: Prior to Admission medications   Medication Sig Start Date End Date Taking? Authorizing Provider  albuterol (VENTOLIN HFA) 108 (90 Base) MCG/ACT inhaler TAKE 2 PUFFS BY MOUTH EVERY 6 HOURS AS NEEDED FOR WHEEZE OR SHORTNESS OF BREATH 05/08/22  Yes Tally Joe T, FNP  azelastine (ASTELIN) 0.1 % nasal spray Place 2 sprays into both nostrils 2 (two) times daily. Use in each nostril as directed 05/08/22  Yes Tally Joe T, FNP  buPROPion (WELLBUTRIN XL) 150 MG 24 hr tablet Take 1 tablet (150 mg total) by mouth daily. 06/01/22  Yes Tally Joe T, FNP  ezetimibe (ZETIA) 10 MG tablet Take 1 tablet (10 mg total) by mouth daily. 06/01/22  Yes Gwyneth Sprout, FNP  fluconazole (DIFLUCAN) 150 MG tablet Take 1 tablet (150 mg total) by mouth once for 1 dose. 06/01/22 06/01/22 Yes Gwyneth Sprout, FNP  lisinopril-hydrochlorothiazide (ZESTORETIC) 20-25 MG tablet Take 1 tablet by mouth daily. 06/01/22  Yes Gwyneth Sprout, FNP  metroNIDAZOLE (FLAGYL) 500 MG tablet Take 1 tablet (500 mg total) by mouth 2 (two) times daily for 7 days. 06/01/22 06/08/22 Yes Gwyneth Sprout, FNP  omeprazole (PRILOSEC) 40 MG capsule Take 1 capsule (40 mg total) by mouth daily. 06/01/22  Yes Gwyneth Sprout, FNP     Physical Exam Vitals: Blood pressure 129/65, pulse 82, weight 229 lb 1.6 oz (103.9 kg).  General: NAD HEENT: normocephalic, anicteric Thyroid: no enlargement, no palpable nodules Pulmonary: No increased work of breathing, CTAB Cardiovascular: RRR, distal pulses 2+ Breast: Breast symmetrical, no tenderness, no palpable nodules or masses, no skin or nipple retraction present, no nipple discharge.  No axillary or supraclavicular lymphadenopathy. Abdomen: NABS, soft, tender over right quadrant. non-distended.  Umbilicus without lesions.  No hepatomegaly, splenomegaly or masses palpable. No evidence of hernia  Genitourinary:  External: Normal external female genitalia.  Normal urethral meatus, normal Bartholin's and Skene's glands.    Vagina: Normal vaginal mucosa, no evidence of prolapse.    Cervix: unable  to fully visualize cuff vs cervix.   Uterus: surgically absent  Adnexa: surgically absent on one side, not palpable on other side.   Rectal: deferred  Lymphatic: no evidence of inguinal lymphadenopathy Extremities: no edema, erythema, or tenderness Neurologic: Grossly intact Psychiatric: mood appropriate, affect full   Assessment: 62 y.o. No obstetric history on file. routine annual exam  Plan: Problem List Items Addressed This Visit   None Visit Diagnoses     Well woman exam    -  Primary   Relevant Orders   Cytology - PAP   Cervical cancer screening       Relevant Orders   Cytology - PAP       1) Mammogram - recommend yearly screening mammogram.  Mammogram Is up to date  2) STI screening  wasoffered and declined  3) ASCCP guidelines and rational discussed.  Patient opts for every 5 years screening interval  4) Osteoporosis  - per USPTF routine screening DEXA at age 51   Consider FDA-approved medical therapies in postmenopausal women and men aged 68 years and older, based on the following: a) A hip or vertebral (clinical or morphometric) fracture b) T-score ?  -2.5 at the femoral neck or spine after appropriate evaluation to exclude secondary causes C) Low bone mass (T-score between -1.0 and -2.5 at the femoral neck or spine) and a 10-year probability of a hip fracture ? 3% or a 10-year probability of a major osteoporosis-related fracture ? 20% based on the [place]-adapted WHO algorithm   5) Routine healthcare maintenance including cholesterol, diabetes screening discussed managed by PCP  6) Colonoscopy 1 year ago, polyps found.  Screening recommended starting at age 48 for average risk individuals, age 61 for individuals deemed at increased risk (including African Americans) and recommended to continue until age 46.  For patient age 90-85 individualized approach is recommended.  Gold standard screening is via colonoscopy, Cologuard screening is an acceptable alternative for patient unwilling or unable to undergo colonoscopy.  "Colorectal cancer screening for average?risk adults: 2018 guideline update from the American Cancer Society"CA: A Cancer Journal for Clinicians: Aug 22, 2016   7) Abd pain: reviewed possible sources of pain-constipation, possible adhesions form abdominal surgery-reviewed castor oil packs. Offered repeat US, pt prefers to try home remedies and will call if she desires  another [place].    Roberto Scales, CNM  , [facility] 06/01/2022, 3:17 PM

## 2022-06-06 LAB — CYTOLOGY - PAP
Comment: NEGATIVE
Diagnosis: NEGATIVE
High risk HPV: NEGATIVE

## 2022-06-07 ENCOUNTER — Ambulatory Visit: Payer: Managed Care, Other (non HMO) | Admitting: Family Medicine

## 2022-06-23 ENCOUNTER — Other Ambulatory Visit: Payer: Self-pay | Admitting: Family Medicine

## 2022-07-03 ENCOUNTER — Other Ambulatory Visit: Payer: Self-pay

## 2022-07-03 ENCOUNTER — Emergency Department
Admission: EM | Admit: 2022-07-03 | Discharge: 2022-07-03 | Disposition: A | Discharge status: Home / Self Care | Payer: Managed Care, Other (non HMO) | Attending: Emergency Medicine | Admitting: Emergency Medicine

## 2022-07-03 DIAGNOSIS — N390 Urinary tract infection, site not specified: Secondary | ICD-10-CM

## 2022-07-03 DIAGNOSIS — I10 Essential (primary) hypertension: Secondary | ICD-10-CM | POA: Insufficient documentation

## 2022-07-03 DIAGNOSIS — R112 Nausea with vomiting, unspecified: Secondary | ICD-10-CM | POA: Diagnosis present

## 2022-07-03 DIAGNOSIS — R197 Diarrhea, unspecified: Secondary | ICD-10-CM | POA: Insufficient documentation

## 2022-07-03 DIAGNOSIS — K529 Noninfective gastroenteritis and colitis, unspecified: Secondary | ICD-10-CM

## 2022-07-03 HISTORY — DX: Essential (primary) hypertension: I10

## 2022-07-03 LAB — CBC WITH DIFFERENTIAL/PLATELET
Abs Immature Granulocytes: 0.02 10*3/uL (ref 0.00–0.07)
Basophils Absolute: 0 10*3/uL (ref 0.0–0.1)
Basophils Relative: 0 %
Eosinophils Absolute: 0 10*3/uL (ref 0.0–0.5)
Eosinophils Relative: 1 %
HCT: 39.9 % (ref 36.0–46.0)
Hemoglobin: 12.6 g/dL (ref 12.0–15.0)
Immature Granulocytes: 0 %
Lymphocytes Relative: 8 %
Lymphs Abs: 0.6 10*3/uL — ABNORMAL LOW (ref 0.7–4.0)
MCH: 26.6 pg (ref 26.0–34.0)
MCHC: 31.6 g/dL (ref 30.0–36.0)
MCV: 84.4 fL (ref 80.0–100.0)
Monocytes Absolute: 0.3 10*3/uL (ref 0.1–1.0)
Monocytes Relative: 4 %
Neutro Abs: 6.3 10*3/uL (ref 1.7–7.7)
Neutrophils Relative %: 87 %
Platelets: 284 10*3/uL (ref 150–400)
RBC: 4.73 MIL/uL (ref 3.87–5.11)
RDW: 13.2 % (ref 11.5–15.5)
WBC: 7.2 10*3/uL (ref 4.0–10.5)
nRBC: 0 % (ref 0.0–0.2)

## 2022-07-03 LAB — COMPREHENSIVE METABOLIC PANEL
ALT: 23 U/L (ref 0–44)
AST: 23 U/L (ref 15–41)
Albumin: 4.2 g/dL (ref 3.5–5.0)
Alkaline Phosphatase: 80 U/L (ref 38–126)
Anion gap: 11 (ref 5–15)
BUN: 13 mg/dL (ref 6–20)
CO2: 24 mmol/L (ref 22–32)
Calcium: 8.8 mg/dL — ABNORMAL LOW (ref 8.9–10.3)
Chloride: 102 mmol/L (ref 98–111)
Creatinine, Ser: 0.86 mg/dL (ref 0.44–1.00)
GFR, Estimated: >60 mL/min (ref >60)
Glucose, Bld: 155 mg/dL — ABNORMAL HIGH (ref 70–99)
Potassium: 3.3 mmol/L — ABNORMAL LOW (ref 3.5–5.1)
Sodium: 137 mmol/L (ref 135–145)
Total Bilirubin: 1 mg/dL (ref 0.3–1.2)
Total Protein: 7.6 g/dL (ref 6.5–8.1)

## 2022-07-03 LAB — URINALYSIS, ROUTINE W REFLEX MICROSCOPIC
Bilirubin Urine: NEGATIVE
Glucose, UA: NEGATIVE mg/dL
Hgb urine dipstick: NEGATIVE
Ketones, ur: NEGATIVE mg/dL
Nitrite: NEGATIVE
Protein, ur: 30 mg/dL — AB
Specific Gravity, Urine: 1.019 (ref 1.005–1.030)
pH: 7 (ref 5.0–8.0)

## 2022-07-03 LAB — LIPASE, BLOOD: Lipase: 26 U/L (ref 11–51)

## 2022-07-03 MED ORDER — ONDANSETRON 4 MG PO TBDP: 4.0000 mg | ORAL_TABLET | Freq: Three times a day (TID) | ORAL | 0 refills | Status: DC | PRN

## 2022-07-03 MED ORDER — SODIUM CHLORIDE 0.9 % IV BOLUS
1000.0000 mL | Freq: Once | INTRAVENOUS | Status: AC
  Administered 2022-07-03: 1000 mL via INTRAVENOUS

## 2022-07-03 MED ORDER — KETOROLAC TROMETHAMINE 30 MG/ML IJ SOLN
15.0000 mg | Freq: Once | INTRAMUSCULAR | Status: AC
  Administered 2022-07-03: 15 mg via INTRAVENOUS
  Filled 2022-07-03: qty 1

## 2022-07-03 MED ORDER — CEPHALEXIN 500 MG PO CAPS: 500.0000 mg | ORAL_CAPSULE | Freq: Two times a day (BID) | ORAL | 0 refills | Status: DC

## 2022-07-03 MED ORDER — CEPHALEXIN 500 MG PO CAPS
500.0000 mg | ORAL_CAPSULE | Freq: Once | ORAL | Status: AC
  Administered 2022-07-03: 500 mg via ORAL
  Filled 2022-07-03: qty 1

## 2022-07-03 MED ORDER — ONDANSETRON HCL 4 MG/2ML IJ SOLN
4.0000 mg | Freq: Once | INTRAMUSCULAR | Status: AC
  Administered 2022-07-03: 4 mg via INTRAVENOUS
  Filled 2022-07-03: qty 2

## 2022-07-03 NOTE — ED Notes (Signed)
ED Provider at bedside. 

## 2022-07-03 NOTE — ED Triage Notes (Signed)
Pt presents to ER with c/o n/v and abd pain that started yesterday around lunch time.  Pt states she has not been able to keep any food/drink down.  Pt states pain is in lower abdomen and is non radiating.  Pt denies any urinary symptoms.  Denies any hx of abd issues.  Pt is otherwise A&O x4 and in NAD in triage.

## 2022-07-03 NOTE — ED Provider Notes (Signed)
[facility] Provider Note    Event Date/Time   First MD Initiated Contact with Patient 07/03/22 [phone]     (approximate)  History   Chief Complaint: Emesis and Abdominal Pain  HPI  Grace [name] is a 62 y.o. female with a past medical history of anemia, hypertension, presents to the emergency department for nausea vomiting and diarrhea.  According to the patient since last night she has been nauseated frequent episodes of vomiting, 1 episode of diarrhea.  States abdominal cramping but denies any abdominal pain.  Denies any fever cough or congestion.  Patient was worried that she was going to get dehydrated so she came to the emergency department.  Physical Exam   Triage Vital Signs: ED Triage Vitals  Enc Vitals Group     BP 07/03/22 0325 123/87     Pulse Rate 07/03/22 0325 (!) 103     Resp 07/03/22 0325 20     Temp 07/03/22 0325 98.3 F (36.8 C)     Temp Source 07/03/22 0325 Oral     SpO2 07/03/22 0325 97 %     Weight 07/03/22 0326 230 lb (104.3 kg)     Height 07/03/22 0326 5\' 7"  (1.702 m)     Head Circumference --      Peak Flow --      Pain Score 07/03/22 0325 9     Pain Loc --      Pain Edu? --      Excl. in GC? --     Most recent vital signs: Vitals:   07/03/22 0325  BP: 123/87  Pulse: (!) 103  Resp: 20  Temp: 98.3 F (36.8 C)  SpO2: 97%    General: Awake, no distress.  CV:  Good peripheral perfusion.  Regular rate and rhythm  Resp:  Normal effort.  Equal breath sounds bilaterally.  Abd:  No distention.  Soft, very slight diffuse discomfort to palpation but no pain per patient.  Describes it more as cramping discomfort.   ED Results / Procedures / Treatments   MEDICATIONS ORDERED IN ED: Medications  sodium chloride 0.9 % bolus 1,000 mL (has no administration in time range)  ondansetron (ZOFRAN) injection 4 mg (has no administration in time range)  ketorolac (TORADOL) 30 MG/ML injection 15 mg (has no administration in time  range)     IMPRESSION / MDM / ASSESSMENT AND PLAN / ED COURSE  I reviewed the triage vital signs and the nursing notes.  Patient's presentation is most consistent with acute presentation with potential threat to life or bodily function.  Patient presents emergency department for nausea vomiting diarrhea starting last night.  Nonfocal abdominal exam.  Patient's lab work shows a normal CBC with a normal white blood cell count, reassuring chemistry with normal LFTs, normal lipase.  We will place an IV, treat with IV fluids, Zofran and Toradol.  Will continue to closely monitor while awaiting urinalysis results.  Overall patient appears well.  Highly suspect more of a gastroenteritis picture.  Labs show a normal CBC with normal white blood cell count, reassuring chemistry and negative lipase.  Patient's urinalysis does show 21-50 white cells with rare bacteria.  We will treat with Keflex as a precaution.  Will discharge with Zofran to be used if needed for nausea.  Discussed supportive care at home including plenty of fluids and rest.  I also discussed return precautions.  Patient and family agreeable to plan.  FINAL CLINICAL IMPRESSION(S) / ED DIAGNOSES  Nausea vomiting diarrhea Urinary tract infection  Note:  This document was prepared using Dragon voice recognition software and may include unintentional dictation errors.   Minna Antis, MD 07/03/22 [phone]

## 2022-07-27 ENCOUNTER — Ambulatory Visit (INDEPENDENT_AMBULATORY_CARE_PROVIDER_SITE_OTHER): Payer: Managed Care, Other (non HMO) | Admitting: Family Medicine

## 2022-07-27 ENCOUNTER — Encounter: Payer: Self-pay | Admitting: Family Medicine

## 2022-07-27 VITALS — BP 144/69 | HR 68 | Temp 98.7°F | Resp 16 | Ht 67.0 in | Wt 226.0 lb

## 2022-07-27 DIAGNOSIS — Z6835 Body mass index (BMI) 35.0-35.9, adult: Secondary | ICD-10-CM

## 2022-07-27 DIAGNOSIS — Z87440 Personal history of urinary (tract) infections: Secondary | ICD-10-CM | POA: Diagnosis not present

## 2022-07-27 DIAGNOSIS — I10 Essential (primary) hypertension: Secondary | ICD-10-CM

## 2022-07-27 DIAGNOSIS — R7989 Other specified abnormal findings of blood chemistry: Secondary | ICD-10-CM | POA: Diagnosis not present

## 2022-07-27 DIAGNOSIS — E782 Mixed hyperlipidemia: Secondary | ICD-10-CM | POA: Diagnosis not present

## 2022-07-27 LAB — POCT URINALYSIS DIPSTICK
Bilirubin, UA: NEGATIVE
Blood, UA: NEGATIVE
Glucose, UA: NEGATIVE
Ketones, UA: NEGATIVE
Leukocytes, UA: NEGATIVE
Nitrite, UA: NEGATIVE
Protein, UA: POSITIVE — AB
Spec Grav, UA: 1.025 (ref 1.010–1.025)
Urobilinogen, UA: NEGATIVE E.U./dL — AB
pH, UA: 5 (ref 5.0–8.0)

## 2022-07-27 MED ORDER — BUPROPION HCL ER (XL) 150 MG PO TB24: 150.0000 mg | ORAL_TABLET | Freq: Every day | ORAL | 3 refills | Status: DC

## 2022-07-27 MED ORDER — LISINOPRIL-HYDROCHLOROTHIAZIDE 20-25 MG PO TABS: 0.5000 | ORAL_TABLET | Freq: Every day | ORAL | 3 refills | Status: DC

## 2022-07-27 NOTE — Assessment & Plan Note (Signed)
Encourage use of zetia 10 mg with diet/exercise to meet HLD goals with HTN and prediabetes recommend diet low in saturated fat and regular exercise - 30 min at least 5 times per week

## 2022-07-27 NOTE — Assessment & Plan Note (Signed)
Chronic, minimal weight gain/loss Body mass index is 35.4 kg/m. Associated with HTN, HLD, pre-diabetes Continue to recommend balanced, lower carb meals. Smaller meal size, adding snacks. Choosing water as drink of choice and increasing purposeful exercise.

## 2022-07-27 NOTE — Assessment & Plan Note (Signed)
Chronic, remains uncontrolled Patient admits to poor compliance with BP medication; Zestoretic 20-25 Encourage 1/2 tab daily vs every other day as she becomes symptomatic with fluid shifts and gets weak Encouraged her that her bp is not low when it is 100-110/60 and continued diet and exercise can assist in reduction of medication overall

## 2022-07-27 NOTE — Progress Notes (Signed)
I,J'ya E Hunter,acting as a scribe for Jacky Kindle, FNP.,have documented all relevant documentation on the behalf of Jacky Kindle, FNP,as directed by  Jacky Kindle, FNP while in the presence of Jacky Kindle, FNP.   Established patient visit   Patient: Grace [name]   DOB: 1960/11/23   62 y.o. Female  MRN: [id number] Visit Date: 07/27/2022  Today's healthcare provider: Jacky Kindle, FNP   Chief Complaint  Patient presents with   Follow-up   Subjective    HPI  Follow up for Gastroesophageal reflux disease with esophagitis without hemorrhage   The patient was last seen for this 2 months ago. Changes made at last visit include start prilosec 40 mg.  She reports  that she has it and is taking it PRN  compliance with treatment. She feels that condition is Improved. She is not having side effects.   Patient was seen at [facility] on 07/03/2022 and was treated for viral gastroenteritis and UTI.  -----------------------------------------------------------------------------------------   Medications: Outpatient Medications Prior to Visit  Medication Sig   albuterol (VENTOLIN HFA) 108 (90 Base) MCG/ACT inhaler TAKE 2 PUFFS BY MOUTH EVERY 6 HOURS AS NEEDED FOR WHEEZE OR SHORTNESS OF BREATH   azelastine (ASTELIN) 0.1 % nasal spray Place 2 sprays into both nostrils 2 (two) times daily. Use in each nostril as directed   [DISCONTINUED] buPROPion (WELLBUTRIN XL) 150 MG 24 hr tablet TAKE 1 TABLET BY MOUTH EVERY DAY   [DISCONTINUED] lisinopril-hydrochlorothiazide (ZESTORETIC) 20-25 MG tablet Take 1 tablet by mouth daily.   [DISCONTINUED] ondansetron (ZOFRAN-ODT) 4 MG disintegrating tablet Take 1 tablet (4 mg total) by mouth every 8 (eight) hours as needed for nausea or vomiting.   ezetimibe (ZETIA) 10 MG tablet Take 1 tablet (10 mg total) by mouth daily. (Patient not taking: Reported on 07/27/2022)   [DISCONTINUED] cephALEXin (KEFLEX) 500 MG capsule Take 1 capsule (500 mg total) by mouth  2 (two) times daily. (Patient not taking: Reported on 07/27/2022)   [DISCONTINUED] fluconazole (DIFLUCAN) 150 MG tablet Take 150 mg by mouth once. (Patient not taking: Reported on 07/27/2022)   [DISCONTINUED] fluocinonide ointment (LIDEX) 0.05 % Apply 1 Application topically 2 (two) times daily. (Patient not taking: Reported on 07/27/2022)   [DISCONTINUED] hydrocortisone 2.5 % cream Apply 1 Application topically 2 (two) times daily. (Patient not taking: Reported on 07/27/2022)   [DISCONTINUED] omeprazole (PRILOSEC) 40 MG capsule Take 1 capsule (40 mg total) by mouth daily. (Patient not taking: Reported on 07/27/2022)   No facility-administered medications prior to visit.    Review of Systems   Objective    BP (!) 144/69 (BP Location: Right Arm, Patient Position: Sitting, Cuff Size: Large)   Pulse 68   Temp 98.7 F (37.1 C) (Oral)   Resp 16   Ht 5\' 7"  (1.702 m)   Wt 226 lb (102.5 kg)   SpO2 100%   BMI 35.40 kg/m   Physical Exam Vitals and nursing note reviewed.  Constitutional:      General: She is not in acute distress.    Appearance: Normal appearance. She is obese. She is not ill-appearing, toxic-appearing or diaphoretic.  HENT:     Head: Normocephalic and atraumatic.  Cardiovascular:     Rate and Rhythm: Normal rate and regular rhythm.     Pulses: Normal pulses.     Heart sounds: Normal heart sounds. No murmur heard.    No friction rub. No gallop.  Pulmonary:     Effort: Pulmonary  effort is normal. No respiratory distress.     Breath sounds: Normal breath sounds. No stridor. No wheezing, rhonchi or rales.  Chest:     Chest wall: No tenderness.  Abdominal:     General: Bowel sounds are normal.     Palpations: Abdomen is soft.     Tenderness: There is no abdominal tenderness. There is no right CVA tenderness, left CVA tenderness or guarding.  Musculoskeletal:        General: No swelling, tenderness, deformity or signs of injury. Normal range of motion.     Right lower leg: No  edema.     Left lower leg: No edema.  Skin:    General: Skin is warm and dry.     Capillary Refill: Capillary refill takes less than 2 seconds.     Coloration: Skin is not jaundiced or pale.     Findings: No bruising, erythema, lesion or rash.  Neurological:     General: No focal deficit present.     Mental Status: She is alert and oriented to person, place, and time. Mental status is at baseline.     Cranial Nerves: No cranial nerve deficit.     Sensory: No sensory deficit.     Motor: No weakness.     Coordination: Coordination normal.  Psychiatric:        Mood and Affect: Mood normal.        Behavior: Behavior normal.        Thought Content: Thought content normal.        Judgment: Judgment normal.     Results for orders placed or performed in visit on 07/27/22  POCT Urinalysis Dipstick  Result Value Ref Range   Color, UA Yellow    Clarity, UA Cloudy    Glucose, UA Negative Negative   Bilirubin, UA Negative    Ketones, UA Negative    Spec Grav, UA 1.025 1.010 - 1.025   Blood, UA Negative    pH, UA 5.0 5.0 - 8.0   Protein, UA Positive (A) Negative   Urobilinogen, UA negative (A) 0.2 or 1.0 E.U./dL   Nitrite, UA Negative    Leukocytes, UA Negative Negative   Appearance     Odor      Assessment & Plan     Problem List Items Addressed This Visit       Cardiovascular and Mediastinum   Primary hypertension - Primary    Chronic, remains uncontrolled Patient admits to poor compliance with BP medication; Zestoretic 20-25 Encourage 1/2 tab daily vs every other day as she becomes symptomatic with fluid shifts and gets weak Encouraged her that her bp is not low when it is 100-110/60 and continued diet and exercise can assist in reduction of medication overall      Relevant Medications   lisinopril-hydrochlorothiazide (ZESTORETIC) 20-25 MG tablet     Genitourinary   History of UTI    UA normal Continue to monitor risk with increased PO fluids and monitor for vaginal  dryness which may promote Ecoli transfer      Relevant Orders   POCT Urinalysis Dipstick (Completed)     Other   Class 2 severe obesity due to excess calories with serious comorbidity and body mass index (BMI) of 35.0 to 35.9 in adult [facility])    Chronic, Body mass index is 35.4 kg/m. Discussed importance of healthy weight management Discussed diet and exercise       Relevant Medications   buPROPion (WELLBUTRIN XL) 150 MG 24  hr tablet   High serum low-density lipoprotein (LDL)    Encourage use of zetia 10 mg with diet/exercise to meet HLD goals with HTN and prediabetes recommend diet low in saturated fat and regular exercise - 30 min at least 5 times per week       Mixed hyperlipidemia    Chronic, previously elevated Has not committed to starting zetia Encourage today to assist with plan to repeat LP in 3 months       Relevant Medications   lisinopril-hydrochlorothiazide (ZESTORETIC) 20-25 MG tablet   Morbid obesity (HCC)    Chronic, minimal weight gain/loss Body mass index is 35.4 kg/m. Associated with HTN, HLD, pre-diabetes Continue to recommend balanced, lower carb meals. Smaller meal size, adding snacks. Choosing water as drink of choice and increasing purposeful exercise.       Relevant Medications   buPROPion (WELLBUTRIN XL) 150 MG 24 hr tablet   Return in about 3 months (around 10/27/2022) for HTN management, chonic disease management.     Leilani Merl, FNP, have reviewed all documentation for this visit. The documentation on 07/27/22 for the exam, diagnosis, procedures, and orders are all accurate and complete.  Jacky Kindle, FNP  [facility] [phone] (phone) [phone] (fax)  [facility]

## 2022-07-27 NOTE — Assessment & Plan Note (Signed)
UA normal Continue to monitor risk with increased PO fluids and monitor for vaginal dryness which may promote Ecoli transfer

## 2022-07-27 NOTE — Assessment & Plan Note (Signed)
Chronic, Body mass index is 35.4 kg/m. Discussed importance of healthy weight management Discussed diet and exercise

## 2022-07-27 NOTE — Assessment & Plan Note (Signed)
Chronic, previously elevated Has not committed to starting zetia Encourage today to assist with plan to repeat LP in 3 months

## 2022-08-27 ENCOUNTER — Other Ambulatory Visit: Payer: Self-pay | Admitting: Family Medicine

## 2022-08-28 NOTE — Telephone Encounter (Signed)
Requested Prescriptions  Pending Prescriptions Disp Refills   ezetimibe (ZETIA) 10 MG tablet [Pharmacy Med Name: EZETIMIBE 10 MG TABLET] 90 tablet 2    Sig: TAKE 1 TABLET BY MOUTH EVERY DAY     Cardiovascular:  Antilipid - Sterol Transport Inhibitors Failed - 08/27/2022  2:21 AM      Failed - Lipid Panel in normal range within the last 12 months    Cholesterol, Total  Date Value Ref Range Status  05/08/2022 221 (H) 100 - 199 mg/dL Final   LDL Chol Calc (NIH)  Date Value Ref Range Status  05/08/2022 155 (H) 0 - 99 mg/dL Final   HDL  Date Value Ref Range Status  05/08/2022 39 (L) >39 mg/dL Final   Triglycerides  Date Value Ref Range Status  05/08/2022 146 0 - 149 mg/dL Final         Passed - AST in normal range and within 360 days    AST  Date Value Ref Range Status  07/03/2022 23 15 - 41 U/L Final   SGOT(AST)  Date Value Ref Range Status  03/29/2012 22 15 - 37 Unit/L Final         Passed - ALT in normal range and within 360 days    ALT  Date Value Ref Range Status  07/03/2022 23 0 - 44 U/L Final   SGPT (ALT)  Date Value Ref Range Status  03/29/2012 24 12 - 78 U/L Final         Passed - Patient is not pregnant      Passed - Valid encounter within last 12 months    Recent Outpatient Visits           1 month ago Primary hypertension   [place] [facility] Merita Norton T, FNP   2 months ago Primary hypertension   [place] [facility] Merita Norton T, FNP   3 months ago Acute recurrent pansinusitis   [facility] Merita Norton T, FNP   11 months ago Abdominal pain, unspecified abdominal location   [facility] Alfredia Ferguson, PA-C   12 months ago Need for shingles vaccine   [facility] Merita Norton T, [place]              Refused Prescriptions Disp Refills   omeprazole (PRILOSEC) 40 MG capsule [Pharmacy Med Name: OMEPRAZOLE DR 40 MG CAPSULE] 90  capsule 1    Sig: TAKE 1 CAPSULE (40 MG TOTAL) BY MOUTH DAILY.     Gastroenterology: Proton Pump Inhibitors Passed - 08/27/2022  2:21 AM      Passed - Valid encounter within last 12 months    Recent Outpatient Visits           1 month ago Primary hypertension   [place] [facility] Jacky Kindle, FNP   2 months ago Primary hypertension   [place] [facility] Merita Norton T, FNP   3 months ago Acute recurrent pansinusitis   [facility] Merita Norton T, FNP   11 months ago Abdominal pain, unspecified abdominal location   [facility] Alfredia Ferguson, PA-C   12 months ago Need for shingles vaccine   [facility] Jacky Kindle, FNP

## 2023-04-05 ENCOUNTER — Ambulatory Visit (INDEPENDENT_AMBULATORY_CARE_PROVIDER_SITE_OTHER): Payer: Commercial Managed Care - PPO | Admitting: Family Medicine

## 2023-04-05 VITALS — BP 153/81 | HR 80 | Resp 18 | Ht 67.0 in | Wt 228.0 lb

## 2023-04-05 DIAGNOSIS — R32 Unspecified urinary incontinence: Secondary | ICD-10-CM | POA: Diagnosis not present

## 2023-04-05 DIAGNOSIS — J40 Bronchitis, not specified as acute or chronic: Secondary | ICD-10-CM

## 2023-04-05 DIAGNOSIS — R109 Unspecified abdominal pain: Secondary | ICD-10-CM

## 2023-04-05 DIAGNOSIS — R0981 Nasal congestion: Secondary | ICD-10-CM

## 2023-04-05 DIAGNOSIS — E782 Mixed hyperlipidemia: Secondary | ICD-10-CM

## 2023-04-05 DIAGNOSIS — E785 Hyperlipidemia, unspecified: Secondary | ICD-10-CM

## 2023-04-05 DIAGNOSIS — E66812 Obesity, class 2: Secondary | ICD-10-CM

## 2023-04-05 DIAGNOSIS — R0609 Other forms of dyspnea: Secondary | ICD-10-CM

## 2023-04-05 DIAGNOSIS — J0141 Acute recurrent pansinusitis: Secondary | ICD-10-CM

## 2023-04-05 DIAGNOSIS — I10 Essential (primary) hypertension: Secondary | ICD-10-CM | POA: Diagnosis not present

## 2023-04-05 DIAGNOSIS — Z87898 Personal history of other specified conditions: Secondary | ICD-10-CM | POA: Diagnosis not present

## 2023-04-05 DIAGNOSIS — N898 Other specified noninflammatory disorders of vagina: Secondary | ICD-10-CM

## 2023-04-05 LAB — POCT URINALYSIS DIPSTICK
Bilirubin, UA: NEGATIVE
Blood, UA: NEGATIVE
Glucose, UA: NEGATIVE
Ketones, UA: NEGATIVE
Leukocytes, UA: NEGATIVE
Nitrite, UA: NEGATIVE
Protein, UA: NEGATIVE
Spec Grav, UA: 1.015 (ref 1.010–1.025)
Urobilinogen, UA: NEGATIVE U/dL — AB
pH, UA: 8 (ref 5.0–8.0)

## 2023-04-05 MED ORDER — LISINOPRIL-HYDROCHLOROTHIAZIDE 10-12.5 MG PO TABS
1.0000 | ORAL_TABLET | Freq: Every day | ORAL | 0 refills | Status: DC
Start: 2023-04-05 — End: 2023-06-11

## 2023-04-05 MED ORDER — AZELASTINE HCL 0.1 % NA SOLN: 2.0000 | Freq: Two times a day (BID) | NASAL | 12 refills | Status: DC

## 2023-04-05 MED ORDER — ROSUVASTATIN CALCIUM 5 MG PO TABS
5.0000 mg | ORAL_TABLET | Freq: Every day | ORAL | 3 refills | Status: DC
Start: 2023-04-05 — End: 2023-06-11

## 2023-04-05 MED ORDER — ALBUTEROL SULFATE HFA 108 (90 BASE) MCG/ACT IN AERS: INHALATION_SPRAY | RESPIRATORY_TRACT | 2 refills | Status: DC

## 2023-04-05 NOTE — Progress Notes (Signed)
 Established patient visit   Patient: Grace [name]   DOB: 19-Dec-1960   63 y.o. Female  MRN: [id number] Visit Date: 04/05/2023  Today's healthcare provider: Nancyann Perry, MD   Chief Complaint  Patient presents with   Abdominal Pain   Vaginal Discharge    Smells like urine   Medications    Patient is not sure why she was supposed to take Zetia  so she didn't take it. She is taking lisinopril  10 mg but thought it was supposed to be 20 mg and we show the combo lisinopril  with hydrochlorothiazide  20-25 mg   Subjective    Discussed the use of AI scribe software for clinical note transcription with the patient, who gave verbal consent to proceed.  History of Present Illness   The patient, presents with a chief complaint of vaginal discharge she says smells like urine. She describes the leakage as a 'drop here and there,' resembling urine, and has been managing it with dry weave pads. The patient denies any associated pain, burning, or other symptoms suggestive of a urinary tract infection. She speculates that the issue may be due to weight gain or weakening muscles, and expresses a willingness to try exercises to strengthen the pelvic floor.  The patient has a significant surgical history, including a hysterectomy and removal of an ovarian cyst, which occurred approximately eight years ago. 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   The patient also expresses a desire to restart cholesterol medication, specifically rosuvastatin , at a lower dose due to previous side effects at the 20mg  dose. She also expresses interest in weight loss medication, believing it may help manage her blood pressure. She states she was prescribed something by [name] in the past which she did not tolerate.       Medications: Outpatient Medications Prior to Visit  Medication Sig Note   [DISCONTINUED]  albuterol  (VENTOLIN  HFA) 108 (90 Base) MCG/ACT inhaler TAKE 2 PUFFS BY MOUTH EVERY 6 HOURS AS NEEDED FOR WHEEZE OR SHORTNESS OF BREATH    [DISCONTINUED] azelastine  (ASTELIN ) 0.1 % nasal spray Place 2 sprays into both nostrils 2 (two) times daily. Use in each nostril as directed    [DISCONTINUED] lisinopril  (ZESTRIL ) 10 MG tablet Take 10 mg by mouth daily. 04/05/2023: not taking   [DISCONTINUED] buPROPion  (WELLBUTRIN  XL) 150 MG 24 hr tablet Take 1 tablet (150 mg total) by mouth daily.    [DISCONTINUED] ezetimibe  (ZETIA ) 10 MG tablet TAKE 1 TABLET BY MOUTH EVERY DAY (Patient not taking: Reported on 04/05/2023)    [DISCONTINUED] lisinopril -hydrochlorothiazide  (ZESTORETIC ) 20-25 MG tablet Take 0.5 tablets by mouth daily. (Patient not taking: Reported on 04/05/2023) 04/05/2023: not taking   No facility-administered medications prior to visit.   Review of Systems     Objective    BP (!) 153/81   Pulse 80   Resp 18   Ht 5' 7 (1.702 m)   Wt 228 lb (103.4 kg)   SpO2 97%   BMI 35.71 kg/m   Physical Exam   General: Appearance:    Obese female in no acute distress  Eyes:    PERRL, conjunctiva/corneas clear, EOM's intact       Lungs:     Clear to auscultation bilaterally, respirations unlabored  Heart:    Normal heart rate. Normal rhythm. No murmurs, rubs, or gallops.    MS:   All extremities are intact.  Neurologic:   Awake, alert, oriented x 3. No apparent focal neurological defect.           Results for orders placed or performed in visit on 04/05/23  POCT Urinalysis Dipstick  Result Value Ref Range   Color, UA yellow    Clarity, UA clear    Glucose, UA Negative Negative   Bilirubin, UA neg    Ketones, UA neg    Spec Grav, UA 1.015 1.010 - 1.025   Blood, UA neg    pH, UA 8.0 5.0 - 8.0   Protein, UA Negative Negative   Urobilinogen, UA negative (A) 0.2 or 1.0 E.U./dL   Nitrite, UA neg    Leukocytes, UA Negative Negative   Appearance     Odor      Assessment & Plan         Urinary Incontinence -suspected Daily urinary leakage, no signs of infection. Possible weak pelvic floor muscles. No clear stress incontinence symptoms. -Start Kegel exercises. -If no improvement in 2-3 weeks, consider referral for urodynamic studies. -Send urine for culture to rule out UTI.  Hyperlipidemia Previous intolerance to high dose rosuvastatin . -Start rosuvastatin  5mg . -Follow-up with Dr. Angelena to monitor tolerance and efficacy.  Weight Management Expressed interest in weight loss medications. -Schedule appointment with Dr. Angelena to discuss weight loss strategies and potential medications.  Hypertension- Unclear exactly what she is taking for her blood pressure at this time as there are none showing in the dispense history Send in rx for lisinopril -hctz 10-12.5 for now and reassess at her appt to establish with Dr. Donzella.   Refill albuterol  HFA and azelastine  nasal inhaler      Return in about 6 weeks (around 05/17/2023) for establish with Dr. Donzella re cholesterol, htn, and weight medications. Grace [name] Nancyann Perry, MD  [facility] [phone] (phone) [phone] (fax)  [facility]

## 2023-04-05 NOTE — Patient Instructions (Addendum)
 Please review the attached list of medications and notify my office if there are any errors.   Please bring all of your medications to every appointment so we can make sure that our medication list is the same as yours.   Call if not doing much better after doing the Kegel exercises for 2-3 weeks.

## 2023-04-08 LAB — URINE CULTURE

## 2023-05-17 ENCOUNTER — Ambulatory Visit: Payer: Self-pay | Admitting: Family Medicine

## 2023-06-11 ENCOUNTER — Encounter: Payer: Self-pay | Admitting: Family Medicine

## 2023-06-11 ENCOUNTER — Ambulatory Visit (INDEPENDENT_AMBULATORY_CARE_PROVIDER_SITE_OTHER): Payer: Managed Care, Other (non HMO) | Admitting: Family Medicine

## 2023-06-11 ENCOUNTER — Other Ambulatory Visit: Payer: Self-pay

## 2023-06-11 VITALS — BP 141/76 | HR 72 | Temp 98.3°F | Resp 18 | Ht 67.0 in | Wt 218.5 lb

## 2023-06-11 DIAGNOSIS — E559 Vitamin D deficiency, unspecified: Secondary | ICD-10-CM

## 2023-06-11 DIAGNOSIS — I10 Essential (primary) hypertension: Secondary | ICD-10-CM

## 2023-06-11 DIAGNOSIS — N182 Chronic kidney disease, stage 2 (mild): Secondary | ICD-10-CM | POA: Diagnosis not present

## 2023-06-11 DIAGNOSIS — Z1329 Encounter for screening for other suspected endocrine disorder: Secondary | ICD-10-CM | POA: Diagnosis not present

## 2023-06-11 DIAGNOSIS — E66811 Obesity, class 1: Secondary | ICD-10-CM

## 2023-06-11 DIAGNOSIS — Z13228 Encounter for screening for other metabolic disorders: Secondary | ICD-10-CM | POA: Diagnosis not present

## 2023-06-11 DIAGNOSIS — Z130 Encounter for screening for diseases of the blood and blood-forming organs and certain disorders involving the immune mechanism: Secondary | ICD-10-CM | POA: Diagnosis not present

## 2023-06-11 DIAGNOSIS — E782 Mixed hyperlipidemia: Secondary | ICD-10-CM | POA: Diagnosis not present

## 2023-06-11 MED ORDER — BUPROPION HCL ER (SR) 150 MG PO TB12
ORAL_TABLET | ORAL | 1 refills | Status: DC
Start: 2023-06-11 — End: 2024-01-14
  Filled 2023-06-11: qty 60, 30d supply, fill #0

## 2023-06-11 MED ORDER — TIRZEPATIDE-WEIGHT MANAGEMENT 2.5 MG/0.5ML ~~LOC~~ SOAJ
2.5000 mg | SUBCUTANEOUS | 0 refills | Status: DC
Start: 2023-06-11 — End: 2023-11-15
  Filled 2023-06-11 – 2023-11-08 (×2): qty 2, 28d supply, fill #0

## 2023-06-11 MED ORDER — NALTREXONE HCL 50 MG PO TABS
25.0000 mg | ORAL_TABLET | Freq: Every day | ORAL | 1 refills | Status: DC
Start: 2023-06-11 — End: 2024-01-28
  Filled 2023-06-11: qty 15, 30d supply, fill #0

## 2023-06-11 MED ORDER — HYDROCHLOROTHIAZIDE 12.5 MG PO CAPS
12.5000 mg | ORAL_CAPSULE | Freq: Every day | ORAL | 1 refills | Status: DC
Start: 2023-06-11 — End: 2024-01-14
  Filled 2023-06-11: qty 30, 30d supply, fill #0
  Filled 2023-10-24 – 2023-11-08 (×2): qty 30, 30d supply, fill #1

## 2023-06-11 MED ORDER — LISINOPRIL 5 MG PO TABS
5.0000 mg | ORAL_TABLET | Freq: Every day | ORAL | 1 refills | Status: DC
Start: 2023-06-11 — End: 2024-01-14
  Filled 2023-06-11: qty 30, 30d supply, fill #0
  Filled 2023-10-24 – 2023-11-08 (×2): qty 30, 30d supply, fill #1

## 2023-06-11 MED ORDER — EZETIMIBE 10 MG PO TABS
10.0000 mg | ORAL_TABLET | Freq: Every day | ORAL | 3 refills | Status: DC
Start: 2023-06-11 — End: 2024-01-14
  Filled 2023-06-11: qty 90, 90d supply, fill #0

## 2023-06-11 NOTE — Progress Notes (Signed)
 Established patient visit   Patient: Grace [name]   DOB: 06-09-1960   63 y.o. Female  MRN: [id number] Visit Date: 06/11/2023  Today's healthcare provider: Sherlyn Hay, DO   Chief Complaint  Patient presents with   Hyperlipidemia   Hypertension   Obesity   Subjective    HPI Grace [name] is a 63 year old female with hypertension and hyperlipidemia who presents for medication management and weight loss consultation.  She is currently taking lisinopril for hypertension, using a half tablet every other day. Her blood pressure readings have been variable, with some readings as low as 108/60 mmHg and as high as 151 mmHg in the morning. She experiences symptoms of feeling 'woozy' and lightheaded when her blood pressure drops to around 115 mmHg.  She is seeking assistance with weight management, believing that weight loss may alleviate some of her health issues. She has not tried any weight loss treatments in the past and is unfamiliar with available options. She has lost approximately ten pounds since January and is working on further weight loss.  She has a history of intolerance to statins, specifically rosuvastatin, which caused nausea and vomiting. She has not taken any cholesterol medication since experiencing these side effects.  She has a family history of high blood pressure and stroke, with her sister recently experiencing a major stroke resulting in right-sided weakness and speech difficulties.     Medications: Outpatient Medications Prior to Visit  Medication Sig Note   albuterol (VENTOLIN HFA) 108 (90 Base) MCG/ACT inhaler TAKE 2 PUFFS BY MOUTH EVERY 6 HOURS AS NEEDED FOR WHEEZE OR SHORTNESS OF BREATH    azelastine (ASTELIN) 0.1 % nasal spray Place 2 sprays into both nostrils 2 (two) times daily. Use in each nostril as directed    [DISCONTINUED] lisinopril-hydrochlorothiazide (ZESTORETIC) 10-12.5 MG tablet Take 1 tablet by mouth daily. (Patient taking  differently: Take 1 tablet by mouth daily. 1/2 tablet every other day)    [DISCONTINUED] rosuvastatin (CRESTOR) 5 MG tablet Take 1 tablet (5 mg total) by mouth daily. (Patient not taking: Reported on 06/11/2023) 06/11/2023: n/v   No facility-administered medications prior to visit.        Objective    BP (!) 141/76   Pulse 72   Temp 98.3 F (36.8 C)   Resp 18   Ht 5\' 7"  (1.702 m)   Wt 218 lb 8 oz (99.1 kg)   SpO2 99%   BMI 34.22 kg/m     Physical Exam Vitals and nursing note reviewed.  Constitutional:      General: She is not in acute distress.    Appearance: Normal appearance.  HENT:     Head: Normocephalic and atraumatic.  Eyes:     General: No scleral icterus.    Conjunctiva/sclera: Conjunctivae normal.  Cardiovascular:     Rate and Rhythm: Normal rate.  Pulmonary:     Effort: Pulmonary effort is normal.  Neurological:     Mental Status: She is alert and oriented to person, place, and time. Mental status is at baseline.  Psychiatric:        Mood and Affect: Mood normal.        Behavior: Behavior normal.      No results found for any visits on 06/11/23.  Assessment & Plan    Primary hypertension -     Comprehensive metabolic panel with GFR -     Lisinopril; Take 1 tablet (5 mg total) by mouth daily.  Dispense: 30 tablet; Refill: 1 -     hydroCHLOROthiazide; Take 1 capsule (12.5 mg total) by mouth daily.  Dispense: 30 capsule; Refill: 1  Mixed hyperlipidemia -     Lipid panel -     Ezetimibe; Take 1 tablet (10 mg total) by mouth daily.  Dispense: 90 tablet; Refill: 3  Obesity (BMI 30.0-34.9) -     Hemoglobin A1c -     Tirzepatide-Weight Management; Inject 2.5 mg into the skin once a week.  Dispense: 2 mL; Refill: 0 -     buPROPion HCl ER (SR); Take 1 tablet (150 mg total) by mouth daily for 3 days, THEN 1 tablet (150 mg total) 2 (two) times daily  Dispense: 60 tablet; Refill: 1 -     Naltrexone HCl; Take 0.5 tablets (25 mg total) by mouth daily.  Dispense:  15 tablet; Refill: 1  Vitamin D deficiency -     VITAMIN D 25 Hydroxy (Vit-D Deficiency, Fractures)  Chronic kidney disease, stage 2, mildly decreased GFR -     Microalbumin / creatinine urine ratio  Screening for endocrine, metabolic and immunity disorder -     Hemoglobin A1c   Hypertension Hypertension with episodes of elevated blood pressure, managed with lisinopril and hydrochlorothiazide. Inconsistent blood pressure readings and symptoms of dizziness and lightheadedness with current lisinopril dosage. - Prescribe lisinopril 5 mg daily - Prescribe hydrochlorothiazide 12.5 mg daily - Instruct to monitor blood pressure daily and maintain a log - Bring blood pressure log and cuff to next appointment for comparison  Hyperlipidemia Hyperlipidemia with statin intolerance, specifically rosuvastatin, due to nausea and vomiting. Elevated cholesterol levels with a 12.2% ten-year risk of cardiovascular events. Ezetimibe prescribed as an alternative, with consideration of bempedoic acid if needed. - Discontinue rosuvastatin - Prescribe ezetimibe - Consider adding bempedoic acid at next visit if ezetimibe is insufficient - Monitor cholesterol levels  Obesity Obesity with a goal of weight loss for health improvement. Weight loss since January, seeking further assistance. Phentermine declined due to EKG requirement and side effects. Bupropion and naltrexone prescribed as a generic alternative to Contrave. Wegovy injection discussed as an option if covered by insurance. - Prescribe bupropion and naltrexone as a generic alternative to Contrave - Discuss potential use of Wegovy injection if insurance covers it - Instruct to start one medication, wait a week, then add the second medication if no adverse effects occur - Encourage portion control and healthy eating habits  General Health Maintenance Regular lab work needed to monitor potassium, calcium, and cholesterol levels. Family history of  stroke underscores importance of managing blood pressure and cholesterol. - Order lab work to check potassium, calcium, and cholesterol levels, with plan to obtain next month   Return in about 6 weeks (around 07/23/2023).      I discussed the assessment and treatment plan with the patient  The patient was provided an opportunity to ask questions and all were answered. The patient agreed with the plan and demonstrated an understanding of the instructions.   The patient was advised to call back or seek an in-person evaluation if the symptoms worsen or if the condition fails to improve as anticipated.    Sherlyn Hay, DO  [facility] [phone] (phone) [phone] (fax)  [facility]

## 2023-06-12 ENCOUNTER — Other Ambulatory Visit: Payer: Self-pay

## 2023-06-13 ENCOUNTER — Telehealth: Payer: Self-pay

## 2023-06-13 NOTE — Telephone Encounter (Signed)
 Copied from CRM [id number]. Topic: Clinical - Medication Question >> Jun 13, 2023  4:36 PM Alessandra Bevels wrote: Reason for CRM: Patient is calling to ask why was she prescribed  Rx #: [id number]  buPROPion [facility] SR) 150 MG 12 hr tablet [[id number]] and Rx #: [id number]  naltrexone (DEPADE) 50 MG tablet [[id number]] . Patient reporting she is not depressed or a drug addict. Please advise

## 2023-06-14 NOTE — Telephone Encounter (Signed)
 Patient is calling to follow up on the below request.

## 2023-06-18 ENCOUNTER — Other Ambulatory Visit: Payer: Self-pay | Admitting: Family Medicine

## 2023-06-23 ENCOUNTER — Encounter: Payer: Self-pay | Admitting: Family Medicine

## 2023-07-29 ENCOUNTER — Ambulatory Visit: Admitting: Family Medicine

## 2023-08-08 DIAGNOSIS — H52223 Regular astigmatism, bilateral: Secondary | ICD-10-CM | POA: Diagnosis not present

## 2023-09-02 ENCOUNTER — Other Ambulatory Visit: Payer: Self-pay

## 2023-09-02 DIAGNOSIS — L218 Other seborrheic dermatitis: Secondary | ICD-10-CM | POA: Diagnosis not present

## 2023-09-02 DIAGNOSIS — L918 Other hypertrophic disorders of the skin: Secondary | ICD-10-CM | POA: Diagnosis not present

## 2023-09-02 MED ORDER — KETOCONAZOLE 2 % EX CREA
TOPICAL_CREAM | CUTANEOUS | 3 refills | Status: AC | PRN
  Filled 2023-09-02: qty 30, 30d supply, fill #0

## 2023-09-02 MED ORDER — HYDROCORTISONE 2.5 % EX OINT
TOPICAL_OINTMENT | CUTANEOUS | 0 refills | Status: AC
  Filled 2023-09-02: qty 28.35, 30d supply, fill #0

## 2023-10-03 ENCOUNTER — Telehealth: Payer: Self-pay

## 2023-10-03 DIAGNOSIS — Z1231 Encounter for screening mammogram for malignant neoplasm of breast: Secondary | ICD-10-CM

## 2023-10-03 NOTE — Telephone Encounter (Signed)
 Copied from CRM [id number]. Topic: Clinical - Request for Lab/Test Order >> Oct 03, 2023 11:19 AM Montie POUR wrote: Reason for CRM:  Please put order in for her annual Mammogram and please call Diavion when completed

## 2023-10-03 NOTE — Telephone Encounter (Signed)
 Please review. Patient last mammogram 04/06/2022.

## 2023-10-08 NOTE — Addendum Note (Signed)
 Addended by: DONZELLA DOMINO on: 10/08/2023 08:45 AM   Modules accepted: Orders

## 2023-10-08 NOTE — Telephone Encounter (Signed)
 Left message informing pt regarding mammogram being placed. Please call office if pt has further questions.

## 2023-10-24 ENCOUNTER — Other Ambulatory Visit: Payer: Self-pay

## 2023-10-24 DIAGNOSIS — D239 Other benign neoplasm of skin, unspecified: Secondary | ICD-10-CM | POA: Diagnosis not present

## 2023-10-24 DIAGNOSIS — L249 Irritant contact dermatitis, unspecified cause: Secondary | ICD-10-CM | POA: Diagnosis not present

## 2023-10-24 DIAGNOSIS — L918 Other hypertrophic disorders of the skin: Secondary | ICD-10-CM | POA: Diagnosis not present

## 2023-11-04 ENCOUNTER — Other Ambulatory Visit: Payer: Self-pay

## 2023-11-08 ENCOUNTER — Other Ambulatory Visit: Payer: Self-pay

## 2023-11-08 ENCOUNTER — Ambulatory Visit
Admission: RE | Admit: 2023-11-08 | Discharge: 2023-11-08 | Disposition: A | Discharge status: Home / Self Care | Source: Ambulatory Visit | Attending: Family Medicine | Admitting: Family Medicine

## 2023-11-08 DIAGNOSIS — Z1231 Encounter for screening mammogram for malignant neoplasm of breast: Secondary | ICD-10-CM | POA: Insufficient documentation

## 2023-11-12 ENCOUNTER — Other Ambulatory Visit (HOSPITAL_COMMUNITY): Payer: Self-pay

## 2023-11-12 ENCOUNTER — Telehealth: Payer: Self-pay

## 2023-11-12 DIAGNOSIS — I10 Essential (primary) hypertension: Secondary | ICD-10-CM

## 2023-11-12 DIAGNOSIS — E66811 Obesity, class 1: Secondary | ICD-10-CM

## 2023-11-12 DIAGNOSIS — E782 Mixed hyperlipidemia: Secondary | ICD-10-CM

## 2023-11-12 NOTE — Telephone Encounter (Signed)
 Pharmacy Patient Advocate Encounter   Received notification from CoverMyMeds that prior authorization for Zepbound  is required/requested.   Insurance verification completed.   The patient is insured through [employer] (primary) and RX advance (secondary) .   Per test claim: Primary insurance will not cover GLP-1's for weight loss, however, when attempting secondary insurance, the following message is received:

## 2023-11-14 ENCOUNTER — Other Ambulatory Visit (HOSPITAL_COMMUNITY): Payer: Self-pay

## 2023-11-15 ENCOUNTER — Other Ambulatory Visit: Payer: Self-pay

## 2023-11-15 ENCOUNTER — Telehealth: Payer: Self-pay

## 2023-11-15 MED ORDER — SEMAGLUTIDE-WEIGHT MANAGEMENT 0.25 MG/0.5ML ~~LOC~~ SOAJ
0.2500 mg | SUBCUTANEOUS | 0 refills | Status: AC
Start: 2023-11-15 — End: 2023-12-16
  Filled 2023-11-15 (×3): qty 2, 28d supply, fill #0

## 2023-11-15 MED ORDER — SEMAGLUTIDE-WEIGHT MANAGEMENT 0.5 MG/0.5ML ~~LOC~~ SOAJ
0.5000 mg | SUBCUTANEOUS | 0 refills | Status: DC
Start: 2023-12-14 — End: 2024-01-14
  Filled 2023-11-15 – 2023-12-09 (×2): qty 2, 28d supply, fill #0

## 2023-11-15 NOTE — Telephone Encounter (Signed)
 LMTCB, if calls back okay for E2C2 to relay the information also did leave message regarding this information as well.

## 2023-11-15 NOTE — Telephone Encounter (Signed)
 Pharmacy Patient Advocate Encounter   Received notification from Onbase that prior authorization for Wegovy  0.25MG /0.5ML auto-injectors is required/requested.   Insurance verification completed.   The patient is insured through [facility] .   Per test claim: PA required; PA submitted to above mentioned insurance via Latent Key/confirmation #/EOC [id number] Status is pending

## 2023-11-15 NOTE — Addendum Note (Signed)
 Addended by: DONZELLA DOMINO on: 11/15/2023 08:13 AM   Modules accepted: Orders

## 2023-11-18 ENCOUNTER — Telehealth: Payer: Self-pay

## 2023-11-18 ENCOUNTER — Ambulatory Visit: Payer: Self-pay | Admitting: Family Medicine

## 2023-11-18 ENCOUNTER — Other Ambulatory Visit: Payer: Self-pay

## 2023-11-18 NOTE — Telephone Encounter (Unsigned)
 Copied from CRM #[id number]. Topic: Clinical - Request for Lab/Test Order >> Nov 18, 2023  9:47 AM Tinnie BROCKS wrote: Reason for CRM: Pt is requesting for her labs to be ordered at [facility] regional because she works there.

## 2023-11-19 ENCOUNTER — Other Ambulatory Visit: Payer: Self-pay

## 2023-11-19 ENCOUNTER — Ambulatory Visit: Payer: Self-pay | Admitting: Family Medicine

## 2023-11-19 ENCOUNTER — Other Ambulatory Visit
Admission: RE | Admit: 2023-11-19 | Discharge: 2023-11-19 | Disposition: A | Discharge status: Home / Self Care | Attending: Family Medicine | Admitting: Family Medicine

## 2023-11-19 DIAGNOSIS — E559 Vitamin D deficiency, unspecified: Secondary | ICD-10-CM | POA: Diagnosis not present

## 2023-11-19 DIAGNOSIS — E782 Mixed hyperlipidemia: Secondary | ICD-10-CM | POA: Insufficient documentation

## 2023-11-19 DIAGNOSIS — E66811 Obesity, class 1: Secondary | ICD-10-CM | POA: Insufficient documentation

## 2023-11-19 DIAGNOSIS — I10 Essential (primary) hypertension: Secondary | ICD-10-CM | POA: Diagnosis not present

## 2023-11-19 DIAGNOSIS — Z13228 Encounter for screening for other metabolic disorders: Secondary | ICD-10-CM | POA: Diagnosis not present

## 2023-11-19 DIAGNOSIS — Z130 Encounter for screening for diseases of the blood and blood-forming organs and certain disorders involving the immune mechanism: Secondary | ICD-10-CM | POA: Insufficient documentation

## 2023-11-19 DIAGNOSIS — N182 Chronic kidney disease, stage 2 (mild): Secondary | ICD-10-CM | POA: Diagnosis not present

## 2023-11-19 DIAGNOSIS — Z1329 Encounter for screening for other suspected endocrine disorder: Secondary | ICD-10-CM | POA: Insufficient documentation

## 2023-11-19 LAB — COMPREHENSIVE METABOLIC PANEL WITH GFR
ALT: 16 U/L (ref 0–44)
AST: 17 U/L (ref 15–41)
Albumin: 4 g/dL (ref 3.5–5.0)
Alkaline Phosphatase: 66 U/L (ref 38–126)
Anion gap: 7 (ref 5–15)
BUN: 14 mg/dL (ref 8–23)
CO2: 27 mmol/L (ref 22–32)
Calcium: 9.3 mg/dL (ref 8.9–10.3)
Chloride: 105 mmol/L (ref 98–111)
Creatinine, Ser: 0.84 mg/dL (ref 0.44–1.00)
GFR, Estimated: >60 mL/min (ref >60)
Glucose, Bld: 109 mg/dL — ABNORMAL HIGH (ref 70–99)
Potassium: 4 mmol/L (ref 3.5–5.1)
Sodium: 139 mmol/L (ref 135–145)
Total Bilirubin: 0.7 mg/dL (ref 0.0–1.2)
Total Protein: 7.3 g/dL (ref 6.5–8.1)

## 2023-11-19 LAB — LIPID PANEL
Cholesterol: 231 mg/dL — ABNORMAL HIGH (ref 0–200)
HDL: 44 mg/dL (ref >40)
LDL Cholesterol: 170 mg/dL — ABNORMAL HIGH (ref 0–99)
Total CHOL/HDL Ratio: 5.3 ratio
Triglycerides: 86 mg/dL (ref <150)
VLDL: 17 mg/dL (ref 0–40)

## 2023-11-19 LAB — VITAMIN D 25 HYDROXY (VIT D DEFICIENCY, FRACTURES): Vit D, 25-Hydroxy: 21.73 ng/mL — ABNORMAL LOW (ref 30–100)

## 2023-11-19 LAB — HEMOGLOBIN A1C
Hgb A1c MFr Bld: 6.2 % — ABNORMAL HIGH (ref 4.8–5.6)
Mean Plasma Glucose: 131.24 mg/dL

## 2023-11-19 NOTE — Telephone Encounter (Signed)
 Pharmacy Patient Advocate Encounter  Received notification from [facility] that Prior Authorization for Wegovy  0.25MG /0.5ML auto-injectors has been APPROVED from 11/15/2023 to 06/14/2024   PA #/Case ID/Reference #: [id number]

## 2023-11-20 LAB — MICROALBUMIN / CREATININE URINE RATIO
Creatinine, Urine: 26.6 mg/dL
Microalb Creat Ratio: <11 mg/g{creat} (ref 0–29)
Microalb, Ur: <3 ug/mL — ABNORMAL HIGH

## 2023-11-21 ENCOUNTER — Telehealth: Payer: Self-pay

## 2023-11-21 DIAGNOSIS — E559 Vitamin D deficiency, unspecified: Secondary | ICD-10-CM

## 2023-11-21 NOTE — Telephone Encounter (Signed)
 Copied from CRM #[id number]. Topic: Clinical - Medication Question >> Nov 21, 2023  3:13 PM Emylou G wrote: Reason for CRM: Patient called.. wants to know if she can get a script on her Vitamin  D based on labs.. versus OTC.SABRA

## 2023-11-21 NOTE — Telephone Encounter (Signed)
 Noted

## 2023-11-22 ENCOUNTER — Other Ambulatory Visit: Payer: Self-pay

## 2023-11-22 MED ORDER — VITAMIN D (ERGOCALCIFEROL) 1.25 MG (50000 UNIT) PO CAPS
50000.0000 [IU] | ORAL_CAPSULE | ORAL | 1 refills | Status: DC
  Filled 2023-11-22 – 2023-12-09 (×2): qty 12, 84d supply, fill #0

## 2023-11-22 NOTE — Addendum Note (Signed)
 Addended byBETHA DONZELLA DOMINO on: 11/22/2023 05:00 PM   Modules accepted: Orders

## 2023-12-02 ENCOUNTER — Other Ambulatory Visit: Payer: Self-pay

## 2023-12-04 ENCOUNTER — Other Ambulatory Visit: Payer: Self-pay

## 2023-12-04 DIAGNOSIS — L249 Irritant contact dermatitis, unspecified cause: Secondary | ICD-10-CM | POA: Diagnosis not present

## 2023-12-04 DIAGNOSIS — L821 Other seborrheic keratosis: Secondary | ICD-10-CM | POA: Diagnosis not present

## 2023-12-04 DIAGNOSIS — L918 Other hypertrophic disorders of the skin: Secondary | ICD-10-CM | POA: Diagnosis not present

## 2023-12-04 DIAGNOSIS — D239 Other benign neoplasm of skin, unspecified: Secondary | ICD-10-CM | POA: Diagnosis not present

## 2023-12-04 MED ORDER — CLOBETASOL PROPIONATE 0.05 % EX OINT
1.0000 | TOPICAL_OINTMENT | Freq: Two times a day (BID) | CUTANEOUS | 0 refills | Status: DC
  Filled 2023-12-04: qty 30, 15d supply, fill #0

## 2023-12-09 ENCOUNTER — Other Ambulatory Visit: Payer: Self-pay

## 2023-12-17 ENCOUNTER — Other Ambulatory Visit: Payer: Self-pay

## 2023-12-19 ENCOUNTER — Telehealth: Payer: Self-pay

## 2023-12-19 DIAGNOSIS — E782 Mixed hyperlipidemia: Secondary | ICD-10-CM

## 2023-12-19 NOTE — Telephone Encounter (Signed)
 Copied from CRM [id number]. Topic: General - Other >> Dec 19, 2023 12:47 PM Grace [name] wrote: Reason for CRM: Patient is calling in because when she spoke with her provider her cholesterol was extremely high. Patient wants to know if she can have a CT Scoring done at [facility] for this afternoon if possible, if not patient is requesting someone call her to let her know when she can have it done.

## 2023-12-20 NOTE — Telephone Encounter (Signed)
 Copied from CRM #[id number]. Topic: Referral - Request for Referral >> Dec 20, 2023  2:12 PM Zebedee SAUNDERS wrote: Did the patient discuss referral with their provider in the last year? Yes (If No - schedule appointment) (If Yes - send message)  Appointment offered? Yes  Type of order/referral and detailed reason for visit:  CT Scoring done at [facility]  Preference of office, provider, location: [address] [place],  [place]  [address] [phone]  If referral order, have you been seen by this specialty before? No (If Yes, this issue or another issue? When? Where?  Can we respond through MyChart? No

## 2024-01-02 ENCOUNTER — Ambulatory Visit
Admission: RE | Admit: 2024-01-02 | Discharge: 2024-01-02 | Disposition: A | Discharge status: Home / Self Care | Source: Ambulatory Visit | Attending: Family Medicine | Admitting: Family Medicine

## 2024-01-02 DIAGNOSIS — E782 Mixed hyperlipidemia: Secondary | ICD-10-CM | POA: Insufficient documentation

## 2024-01-14 ENCOUNTER — Other Ambulatory Visit: Payer: Self-pay

## 2024-01-14 ENCOUNTER — Ambulatory Visit: Admitting: Family Medicine

## 2024-01-14 ENCOUNTER — Encounter: Payer: Self-pay | Admitting: Family Medicine

## 2024-01-14 VITALS — BP 143/72 | HR 84 | Temp 98.4°F | Ht 67.0 in | Wt 227.2 lb

## 2024-01-14 DIAGNOSIS — R600 Localized edema: Secondary | ICD-10-CM | POA: Diagnosis not present

## 2024-01-14 DIAGNOSIS — E782 Mixed hyperlipidemia: Secondary | ICD-10-CM | POA: Diagnosis not present

## 2024-01-14 DIAGNOSIS — Z0001 Encounter for general adult medical examination with abnormal findings: Secondary | ICD-10-CM

## 2024-01-14 DIAGNOSIS — Z0000 Encounter for general adult medical examination without abnormal findings: Secondary | ICD-10-CM

## 2024-01-14 DIAGNOSIS — M25562 Pain in left knee: Secondary | ICD-10-CM

## 2024-01-14 DIAGNOSIS — E559 Vitamin D deficiency, unspecified: Secondary | ICD-10-CM

## 2024-01-14 DIAGNOSIS — I10 Essential (primary) hypertension: Secondary | ICD-10-CM | POA: Diagnosis not present

## 2024-01-14 DIAGNOSIS — Z7689 Persons encountering health services in other specified circumstances: Secondary | ICD-10-CM

## 2024-01-14 DIAGNOSIS — Z23 Encounter for immunization: Secondary | ICD-10-CM | POA: Diagnosis not present

## 2024-01-14 DIAGNOSIS — Z789 Other specified health status: Secondary | ICD-10-CM

## 2024-01-14 MED ORDER — WEGOVY 1 MG/0.5ML ~~LOC~~ SOAJ
1.0000 mg | SUBCUTANEOUS | 3 refills | Status: DC
  Filled 2024-01-14: qty 2, 28d supply, fill #0
  Filled 2024-02-13 (×2): qty 2, 28d supply, fill #1
  Filled 2024-03-13: qty 2, 28d supply, fill #2

## 2024-01-14 MED ORDER — LISINOPRIL-HYDROCHLOROTHIAZIDE 10-12.5 MG PO TABS
1.0000 | ORAL_TABLET | Freq: Every day | ORAL | 1 refills | Status: AC
  Filled 2024-01-14: qty 90, 90d supply, fill #0

## 2024-01-14 NOTE — Progress Notes (Signed)
 Complete physical exam   Patient: Grace [name]   DOB: 11/20/1960   63 y.o. Female  MRN: [id number] Visit Date: 01/14/2024  Today's healthcare provider: LAURAINE LOISE BUOY, DO   Chief Complaint  Patient presents with   Annual Exam    Diet- General, cutting back from being on Wegovy  Exercise- Yes walks a lot at work Feeling overall- Good Sleep- Good Concerns- Fluid pill makes her cramp really bad, possible decrease the medication  Influenza Vaccine- yes Tdap Vaccine- yes Pneumococcal Vaccine- yes   Subjective    Grace [name] is a 63 y.o. female who presents today for a complete physical exam.   HPI HPI     Annual Exam    Additional comments: Diet- General, cutting back from being on Wegovy  Exercise- Yes walks a lot at work Feeling overall- Good Sleep- Good Concerns- Fluid pill makes her cramp really bad, possible decrease the medication  Influenza Vaccine- yes Tdap Vaccine- yes Pneumococcal Vaccine- yes      Last edited by Terrel Powell CROME, CMA on 01/14/2024  2:34 PM.       Grace [name] is a 63 year old female who presents for a follow-up regarding weight management and medication side effects.  She has been on Wegovy  since the end of August and has experienced a weight gain of ten pounds since mid-March. She initially felt she may have lost some weight, but lacks a weight measurement from August to confirm her starting point just prior to Wegovy . Her husband believes she has lost weight. She is currently on a 0.5 mg dose. Wegovy  has reduced her appetite and improved bowel regularity. No nausea, vomiting, abdominal pain, constipation, or diarrhea while on Wegovy .  She discontinued her cholesterol medication due to severe knee pain and stiffness, which improved after stopping the medication. She also stopped hydrochlorothiazide  due to severe cramping and gastrointestinal issues. She is not currently on any blood pressure medication but was previously on a  combination of lisinopril  and hydrochlorothiazide , which caused dizziness when taken daily.  She has a history of low vitamin D  levels and cannot tolerate high-dose vitamin D  due to gastrointestinal upset. She is currently taking 2000 IU of vitamin D  daily along with a calcium  supplement.  She has a family history of stroke; her sister and mother both had strokes due to uncontrolled hypertension. She is concerned about her own blood pressure and monitors it at home, with recent readings around 134/72 mmHg.  She experienced knee pain after injuring it by jumping onto her bed. She uses topical pain relief creams and believes weight loss may alleviate her symptoms. She does not currently take any medication for arthritis but is considering starting knee exercises.     Past Medical History:  Diagnosis Date   Anemia    Cyst of ovary 09/15/2015   Hx of chlamydia infection    Hypertension    Ovarian cyst    Sleep apnea    Past Surgical History:  Procedure Laterality Date   ABDOMINAL HYSTERECTOMY  2006   not due to cancer-Partial   COLONOSCOPY WITH PROPOFOL  N/A 12/23/2020   Procedure: COLONOSCOPY WITH PROPOFOL ;  Surgeon: Therisa Bi, MD;  Location: [facility] ENDOSCOPY;  Service: Gastroenterology;  Laterality: N/A;   Elbow adjustment Right 03/30/2011   had to pop back into place   TOTAL ABDOMINAL HYSTERECTOMY W/ BILATERAL SALPINGOOPHORECTOMY  09/07/2013   with Dr. Constantino   Social History   Socioeconomic History   Marital status: Married  Spouse name: Pearson   Number of children: Not on file   Years of education: Not on file   Highest education level: Not on file  Occupational History   Not on file  Tobacco Use   Smoking status: Never   Smokeless tobacco: Never  Vaping Use   Vaping status: Never Used  Substance and Sexual Activity   Alcohol use: Yes    Comment: Occasional; once a month   Drug use: No   Sexual activity: Not on file  Other Topics Concern   Not on file   Social History Narrative   Not on file   Social Drivers of Health   Financial Resource Strain: Not on file  Food Insecurity: Not on file  Transportation Needs: Not on file  Physical Activity: Not on file  Stress: Not on file  Social Connections: Not on file  Intimate Partner Violence: Not on file   Family Status  Relation Name Status   Mother Velia Budge Deceased       Hx of stroke, high blood pressure,and diabetes   Father  Deceased       cause of death was bone cancer   Sister 1 Alive   Sister 2 Alive   Sister 3 Alive   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)   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)  No partnership data on file   Family History  Problem Relation Age of Onset   Diabetes Mother    Hypertension Mother    Stroke Mother    Heart disease Mother    Asthma Sister    Hypertension Sister    Hypertension Sister    Breast cancer Maternal Aunt    Breast cancer Cousin    Allergies  Allergen Reactions   Zinc Other (See Comments)    Cramping; abdominal pain   Atorvastatin Nausea And Vomiting   Nickel Rash   Rosuvastatin  Nausea And Vomiting    Patient Care Team: Zannie Locastro N, DO as PCP - General (Family Medicine)   Medications: Outpatient Medications Prior to Visit  Medication Sig Note   hydrocortisone  2.5 % ointment Apply to affected areas on the face twice daily until clear or up to one weeks time, then use as needed for flares. Not for long term use.    ketoconazole  (NIZORAL ) 2 % cream Apply a thin layer to affected areas on the face as needed, daily when flared and weekly as maintenance.    naltrexone  (DEPADE) 50 MG tablet Take 0.5 tablets (25 mg total) by mouth daily.    [DISCONTINUED] lisinopril  (ZESTRIL ) 5 MG tablet Take 1 tablet (5 mg total) by mouth daily. 01/14/2024: recombining per patient preference   [DISCONTINUED] semaglutide -weight management (WEGOVY ) 0.5 MG/0.5ML SOAJ SQ injection Inject 0.5 mg into the skin once a week for 28 days.    [DISCONTINUED] Vitamin D ,  Ergocalciferol , (DRISDOL ) 1.25 MG (50000 UNIT) CAPS capsule Take 1 capsule (50,000 Units total) by mouth every 7 (seven) days. 01/14/2024: Made her stomach hurt.   [DISCONTINUED] albuterol  (VENTOLIN  HFA) 108 (90 Base) MCG/ACT inhaler TAKE 2 PUFFS BY MOUTH EVERY 6 HOURS AS NEEDED FOR WHEEZE OR SHORTNESS OF BREATH    [DISCONTINUED] azelastine  (ASTELIN ) 0.1 % nasal spray Place 2 sprays into both nostrils 2 (two) times daily. Use in each nostril as directed    [DISCONTINUED] buPROPion  (WELLBUTRIN  SR) 150 MG 12 hr tablet Take 1 tablet (150 mg total) by mouth daily for 3 days, THEN 1 tablet (150 mg total) 2 (two) times daily    [  DISCONTINUED] clobetasol  ointment (TEMOVATE ) 0.05 % Apply 1 Application topically 2 (two) times daily.    [DISCONTINUED] ezetimibe  (ZETIA ) 10 MG tablet Take 1 tablet (10 mg total) by mouth daily. 01/14/2024: Had to stop taking couldn't walk, her legs were hurting.   [DISCONTINUED] hydrochlorothiazide  (MICROZIDE ) 12.5 MG capsule Take 1 capsule (12.5 mg total) by mouth daily. (Patient not taking: Reported on 01/14/2024) 01/14/2024: recombining per patient preference   No facility-administered medications prior to visit.       Objective    BP (!) 143/72 (BP Location: Left Arm, Patient Position: Sitting, Cuff Size: Normal)   Pulse 84   Temp 98.4 F (36.9 C) (Oral)   Ht 5' 7 (1.702 m)   Wt 227 lb 3.2 oz (103.1 kg)   SpO2 99%   BMI 35.58 kg/m    Physical Exam Vitals and nursing note reviewed.  Constitutional:      General: She is not in acute distress.    Appearance: Normal appearance.  HENT:     Head: Normocephalic and atraumatic.  Eyes:     General: No scleral icterus.    Conjunctiva/sclera: Conjunctivae normal.  Cardiovascular:     Rate and Rhythm: Normal rate.  Pulmonary:     Effort: Pulmonary effort is normal.  Neurological:     Mental Status: She is alert and oriented to person, place, and time. Mental status is at baseline.  Psychiatric:        Mood and  Affect: Mood normal.        Behavior: Behavior normal.      Last depression screening scores    01/14/2024    2:38 PM 06/11/2023    4:02 PM 04/05/2023   11:39 AM  PHQ 2/9 Scores  PHQ - 2 Score 0 0 0  PHQ- 9 Score 0 0 0   Last fall risk screening    01/14/2024    2:38 PM  Fall Risk   Falls in the past year? 0  Number falls in past yr: 0  Injury with Fall? 0  Risk for fall due to : No Fall Risks   Last Audit-C alcohol use screening    07/27/2022    1:16 PM  Alcohol Use Disorder Test (AUDIT)  1. How often do you have a drink containing alcohol? 0  2. How many drinks containing alcohol do you have on a typical day when you are drinking? 0  3. How often do you have six or more drinks on one occasion? 0  AUDIT-C Score 0   A score of 3 or more in women, and 4 or more in men indicates increased risk for alcohol abuse, EXCEPT if all of the points are from question 1   No results found for any visits on 01/14/24.  Assessment & Plan    Routine Health Maintenance and Physical Exam  Exercise Activities and Dietary recommendations  Goals   None     Immunization History  Administered Date(s) Administered   Hep A / Hep B 06/17/2015   Hepatitis A 12/09/2014, 01/06/2015   Hepatitis B 12/09/2014, 01/06/2015   Influenza, Seasonal, Injecte, Preservative Fre 01/14/2024   Influenza,inj,Quad PF,6+ Mos 04/09/2018, 03/03/2021   Influenza-Unspecified 04/14/2022   Moderna Covid-19 Fall Seasonal Vaccine 68yrs & older 04/14/2022   PFIZER(Purple Top)SARS-COV-2 Vaccination 05/04/2019, 05/25/2019, 01/28/2020   PNEUMOCOCCAL CONJUGATE-20 01/14/2024   Pfizer Covid-19 Vaccine Bivalent Booster 65yrs & up 08/10/2021   Pfizer(Comirnaty)Fall Seasonal Vaccine 12 years and older 04/14/2022   Tdap 07/30/2012, 01/14/2024  Zoster Recombinant(Shingrix ) 03/03/2021, 09/01/2021    Health Maintenance  Topic Date Due   COVID-19 Vaccine (6 - 2025-26 season) 01/30/2024 (Originally 11/25/2023)   Mammogram   11/07/2025   Colonoscopy  12/24/2027   DTaP/Tdap/Td (3 - Td or Tdap) 01/13/2034   Pneumococcal Vaccine: 50+ Years  Completed   Influenza Vaccine  Completed   Hepatitis B Vaccines 19-59 Average Risk  Completed   Hepatitis C Screening  Completed   HIV Screening  Completed   Zoster Vaccines- Shingrix   Completed   HPV VACCINES  Aged Out   Meningococcal B Vaccine  Aged Out    Discussed health benefits of physical activity, and encouraged her to engage in regular exercise appropriate for her age and condition.   Annual physical exam  Statin intolerance  Need for pneumococcal vaccination -     Pneumococcal conjugate vaccine 20-valent  Need for influenza vaccination -     Flu vaccine trivalent PF, 6mos and older(Flulaval,Afluria,Fluarix,Fluzone)  Need for Tdap vaccination -     Tdap vaccine greater than or equal to 7yo IM  Mixed hyperlipidemia  Primary hypertension -     Lisinopril -hydroCHLOROthiazide ; Take 1 tablet by mouth daily.  Dispense: 90 tablet; Refill: 1  Morbid obesity (HCC) -     Wegovy ; Inject 1 mg into the skin once a week.  Dispense: 2 mL; Refill: 3  Encounter for weight management -     Wegovy ; Inject 1 mg into the skin once a week.  Dispense: 2 mL; Refill: 3  Lower extremity edema  Vitamin D  deficiency  Acute pain of left knee     Annual physical exam Physical exam overall unremarkable except as noted above. Routine lab work done 11/19/2023.  Discussed vaccinations including flu, TDAP, and pneumonia. Uncertain about previous pneumonia vaccination. Plans to get COVID booster due to occupational exposure. - Administer flu and TDAP vaccines today. - Verify pneumonia vaccination status and administer if needed. - Encourage getting COVID booster due to occupational exposure.  Primary hypertension Chronic, stable.  Hypertension management complicated by intolerance to hydrochlorothiazide  and lisinopril . Current BP 134/72 mmHg. Family history of stroke due to  uncontrolled hypertension. Prefers lisinopril -hydrochlorothiazide  combination. - Prescribe lisinopril -hydrochlorothiazide  combination at lowest dose (10-12.5 mg daily). - Advise using pill cutter to take half dose daily if needed. - Monitor BP at home regularly. - Follow up in 4-6 weeks to assess BP control and medication tolerance.  Morbid obesity Morbid obesity with associated hypertension and hyperlipidemia.  Management with Wegovy  initiated in August. Reports decreased appetite. Interested in increasing dose to 1 mg weekly. - Increase Wegovy  dose to 1 mg weekly. - Advise monitoring for side effects such as nausea, vomiting, or diarrhea. - Encourage continued attention to dietary habits. - Provide prescription with month's supply and three refills, contingent on insurance approval.  Mixed hyperlipidemia; statin intolerance Mixed hyperlipidemia, with history of statin intolerance due to associated muscle pain and stiffness. Discontinued medication with symptom improvement. - Re-evaluate cholesterol levels at subsequent visit per patient preference. - Encouraged lifestyle modifications.  Vitamin D  deficiency Vitamin D  deficiency managed with OTC vitamin D  2000 IU daily with calcium . Previous high-dose caused GI discomfort. - Continue vitamin D  2000 IU daily with calcium .  Acute pain of left knee Acute left knee pain post-injury managed with topical analgesics. No arthritis-specific exercises or medications used. - Recommend starting knee-focused exercises once pain subsides. - Continue using topical analgesics for pain management.  Lower extremity edema Edema improving with compression socks and weight management. No diuretics  due to intolerance. Decreased edema since starting Wegovy . - Continue wearing compression socks. - Monitor for changes in swelling.  Constipation (improved) Constipation improved with Wegovy , regulating bowel movements. - Continue current regimen with  Wegovy .     Return in about 8 weeks (around 03/09/2024) for HTN.     I discussed the assessment and treatment plan with the patient  The patient was provided an opportunity to ask questions and all were answered. The patient agreed with the plan and demonstrated an understanding of the instructions.   The patient was advised to call back or seek an in-person evaluation if the symptoms worsen or if the condition fails to improve as anticipated.    LAURAINE LOISE BUOY, DO  [facility] [phone] (phone) [phone] (fax)  [facility]

## 2024-01-17 ENCOUNTER — Other Ambulatory Visit: Payer: Self-pay

## 2024-01-28 ENCOUNTER — Telehealth: Payer: Self-pay | Admitting: Family Medicine

## 2024-01-28 NOTE — Addendum Note (Signed)
 Addended by: TERREL POWELL CROME on: 01/28/2024 04:22 PM   Modules accepted: Orders

## 2024-01-28 NOTE — Telephone Encounter (Signed)
 Removed this medication from patient's chart on 01/28/2024.

## 2024-01-28 NOTE — Telephone Encounter (Signed)
 Copied from CRM [id number]. Topic: General - Other >> Jan 28, 2024  1:09 PM Olam RAMAN wrote: Reason for CRM: pt stated she needs naltrexone  (DEPADE) 50 MG tablet  To be removed from her med list she has never taken it

## 2024-02-13 ENCOUNTER — Other Ambulatory Visit: Payer: Self-pay

## 2024-03-13 ENCOUNTER — Other Ambulatory Visit: Payer: Self-pay

## 2024-03-16 ENCOUNTER — Other Ambulatory Visit: Payer: Self-pay

## 2024-03-16 ENCOUNTER — Ambulatory Visit (INDEPENDENT_AMBULATORY_CARE_PROVIDER_SITE_OTHER): Admitting: Family Medicine

## 2024-03-16 ENCOUNTER — Encounter: Payer: Self-pay | Admitting: Family Medicine

## 2024-03-16 VITALS — BP 186/88 | HR 111 | Ht 67.0 in | Wt 221.5 lb

## 2024-03-16 DIAGNOSIS — I10 Essential (primary) hypertension: Secondary | ICD-10-CM | POA: Diagnosis not present

## 2024-03-16 DIAGNOSIS — E559 Vitamin D deficiency, unspecified: Secondary | ICD-10-CM | POA: Diagnosis not present

## 2024-03-16 DIAGNOSIS — J40 Bronchitis, not specified as acute or chronic: Secondary | ICD-10-CM | POA: Diagnosis not present

## 2024-03-16 DIAGNOSIS — J069 Acute upper respiratory infection, unspecified: Secondary | ICD-10-CM

## 2024-03-16 DIAGNOSIS — E66811 Obesity, class 1: Secondary | ICD-10-CM

## 2024-03-16 LAB — POC COVID19 BINAXNOW: SARS Coronavirus 2 Ag: NEGATIVE

## 2024-03-16 LAB — POCT INFLUENZA A/B
Influenza A, POC: NEGATIVE
Influenza B, POC: NEGATIVE

## 2024-03-16 MED ORDER — METHYLPREDNISOLONE 4 MG PO TBPK
ORAL_TABLET | ORAL | 0 refills | Status: AC
  Filled 2024-03-16: qty 21, 6d supply, fill #0

## 2024-03-16 MED ORDER — METHYLPREDNISOLONE ACETATE 40 MG/ML IJ SUSP
40.0000 mg | Freq: Once | INTRAMUSCULAR | Status: AC
  Administered 2024-03-16: 40 mg via INTRAMUSCULAR

## 2024-03-16 MED ORDER — WEGOVY 2.4 MG/0.75ML ~~LOC~~ SOAJ
2.4000 mg | SUBCUTANEOUS | 3 refills | Status: DC
  Filled 2024-03-16: qty 3, 28d supply, fill #0

## 2024-03-16 MED ORDER — AMOXICILLIN-POT CLAVULANATE 875-125 MG PO TABS
1.0000 | ORAL_TABLET | Freq: Two times a day (BID) | ORAL | 0 refills | Status: DC
  Filled 2024-03-16: qty 20, 10d supply, fill #0

## 2024-03-16 MED ORDER — WEGOVY 1.7 MG/0.75ML ~~LOC~~ SOAJ
1.7000 mg | SUBCUTANEOUS | 0 refills | Status: DC
  Filled 2024-03-16: qty 3, 28d supply, fill #0

## 2024-03-16 MED ORDER — LISINOPRIL 5 MG PO TABS
5.0000 mg | ORAL_TABLET | Freq: Every day | ORAL | 3 refills | Status: AC
  Filled 2024-03-16: qty 30, 30d supply, fill #0

## 2024-03-16 NOTE — Progress Notes (Signed)
 "     Established patient visit   Patient: Grace [name]   DOB: 03/24/1961   63 y.o. Female  MRN: [id number] Visit Date: 03/16/2024  Today's healthcare provider: LAURAINE LOISE BUOY, DO   Chief Complaint  Patient presents with   Hypertension    Patient is here for a follow up on hypertension, states that it seems to be doing being good when she is monitoring it.      Cough    Patient states that she has had a cough since Wednesday and could not talk at all Wednesday evening.  The cough is keeping her up at night.  Hoarseness is tending to be coming and going.   Subjective    Hypertension Associated symptoms include palpitations (racing). Pertinent negatives include no chest pain or shortness of breath.  Cough Associated symptoms include chills. Pertinent negatives include no chest pain, fever or shortness of breath.    Grace [name] is a 63 year old female who presents with a persistent cough and respiratory symptoms.  She has experienced a persistent cough since Wednesday, which has led to hoarseness and difficulty speaking. The cough is productive, described as bringing up 'hard stuff' from her chest and radiating to her ear. She experiences a sensation of something 'strangling' her and feels it 'coming up' from her chest.  She has been unable to sleep due to coughing, particularly between 1 to 3 AM, and reports waking up coughing. She attempted to return to work but found it challenging due to her symptoms. She mentions a colleague had similar symptoms.  She reports chest tightness and a sensation of her heart racing. No fever, chills, or chest pain. She frequently feels cold, despite her husband noting the house temperature is warm. She experienced diarrhea early in the morning, described as 'really loose' stool, which woke her up at 3:30 AM.  She has a history of hypertension and has been monitoring her blood pressure at home. Recently, her blood pressure was elevated at 148,  whereas it typically ranges from 128 to 138. She is currently on lisinopril , but her blood pressure has increased since she became ill.  She uses an albuterol  inhaler at home and has a history of using Augmentin  for previous infections. She recalls a past urinary tract infection treated with cephalexin , which resolved her symptoms after a culture confirmed the infection.      Medications: Show/hide medication list[1]  Review of Systems  Constitutional:  Positive for chills. Negative for appetite change, fatigue and fever.  Respiratory:  Positive for cough and chest tightness. Negative for shortness of breath.   Cardiovascular:  Positive for palpitations (racing). Negative for chest pain.  Gastrointestinal:  Positive for diarrhea (one episode of urgent loose stool this morning at 0330; none since). Negative for abdominal pain, nausea and vomiting.  Neurological:  Negative for dizziness and weakness.        Objective    BP (!) 186/88 (BP Location: Left Arm, Patient Position: Sitting, Cuff Size: Large)   Pulse (!) 111   Ht 5' 7 (1.702 m)   Wt 221 lb 8 oz (100.5 kg)   SpO2 100%   BMI 34.69 kg/m     Physical Exam Constitutional:      Appearance: Normal appearance.  HENT:     Head: Normocephalic and atraumatic.  Eyes:     General: No scleral icterus.    Extraocular Movements: Extraocular movements intact.     Conjunctiva/sclera: Conjunctivae normal.  Cardiovascular:  Rate and Rhythm: Regular rhythm. Tachycardia present.     Pulses: Normal pulses.     Heart sounds: Normal heart sounds.  Pulmonary:     Effort: Pulmonary effort is normal. No respiratory distress.     Breath sounds: Wheezing (mild) present.  Musculoskeletal:     Right lower leg: No edema.     Left lower leg: No edema.  Skin:    General: Skin is warm and dry.  Neurological:     Mental Status: She is alert and oriented to person, place, and time. Mental status is at baseline.  Psychiatric:        Mood  and Affect: Mood normal.        Behavior: Behavior normal.      Results for orders placed or performed in visit on 03/16/24  POC COVID-19 BinaxNow  Result Value Ref Range   SARS Coronavirus 2 Ag Negative Negative  POCT Influenza A/B  Result Value Ref Range   Influenza A, POC Negative Negative   Influenza B, POC Negative Negative    Assessment & Plan    Upper respiratory tract infection, unspecified type -     methylPREDNISolone  Acetate -     POC COVID-19 BinaxNow -     POCT Influenza A/B -     methylPREDNISolone ; Take 6 tablets (24 mg total) by mouth daily for 1 day, THEN 5 tablets (20 mg total) daily for 1 day, THEN 4 tablets (16 mg total) daily for 1 day, THEN 3 tablets (12 mg total) daily for 1 day, THEN 2 tablets (8 mg total) daily for 1 day, THEN 1 tablet (4 mg total) daily for 1 day.  Dispense: 21 tablet; Refill: 0 -     Amoxicillin -Pot Clavulanate; Take 1 tablet by mouth 2 (two) times daily.  Dispense: 20 tablet; Refill: 0  Bronchitis -     POC COVID-19 BinaxNow -     POCT Influenza A/B -     Amoxicillin -Pot Clavulanate; Take 1 tablet by mouth 2 (two) times daily.  Dispense: 20 tablet; Refill: 0  Primary hypertension -     Lisinopril ; Take 1 tablet (5 mg total) by mouth daily. (To be taken with lisinopril -hydrochlorothiazide  10-12.5 mg daily (total 15-12.5 mg daily)).  Dispense: 30 tablet; Refill: 3  Obesity (BMI 30.0-34.9) -     Wegovy ; Inject 1.7 mg into the skin once a week.  Dispense: 3 mL; Refill: 0 -     Wegovy ; Inject 2.4 mg into the skin once a week.  Dispense: 3 mL; Refill: 3  Vitamin D  deficiency -     VITAMIN D  25 Hydroxy (Vit-D Deficiency, Fractures)      Acute upper respiratory infection, unspecified type; bronchitis Persistent cough, hoarseness, chest tightness, and wheezing suggest bronchitis. Differential includes viral infection. Decision to treat with antibiotics due to symptoms and wheezing. - Administered methylprednisolone  injection. -  Prescribed oral prednisone  (methylprednisolone ) to start next day. - Tested for flu and COVID-19 - both negative. - Prescribed azithromycin  (Z-Pak). - Advised use of albuterol  inhaler as needed.  Primary hypertension Blood pressure elevated at 148 mmHg, likely due to current illness.  However, reported home blood pressures have been consistently above goal.  Discussed partial increase of lisinopril  dosage to 15 mg daily rather than standard next dose of 20 mg daily, due to previous intolerance to higher doses. - Goal blood pressure <130/80 - Prescribed 5 mg lisinopril  in addition to current dosage (for a total lisinopril  dosage of 15 mg). - Monitor blood  pressure at home. - Scheduled follow-up to assess blood pressure control.  Obesity (BMI 30.0-34.9) Patient doing well on Wegovy .  Discussed maintaining current dose versus increasing it; patient prefers to increase for maximum benefit with goal of being able to taper off as it is reasonable.  Prescribed next dose of Wegovy  (1.7 mg weekly) as well as follow-up dose of 2.4 mg weekly.  Counseled patient to notify clinic if she has difficulty tolerating the 1.7 mg dose, and we will plan to continue that dose longer rather than having her transition up to 2.4 mg.    Return in about 6 weeks (around 04/27/2024) for HTN, Weight.      I discussed the assessment and treatment plan with the patient  The patient was provided an opportunity to ask questions and all were answered. The patient agreed with the plan and demonstrated an understanding of the instructions.   The patient was advised to call back or seek an in-person evaluation if the symptoms worsen or if the condition fails to improve as anticipated.    LAURAINE LOISE BUOY, DO  [facility] [phone] (phone) [phone] (fax)  [facility]     [1]  Outpatient Medications Prior to Visit  Medication Sig   hydrocortisone  2.5 % ointment Apply to  affected areas on the face twice daily until clear or up to one weeks time, then use as needed for flares. Not for long term use.   ketoconazole  (NIZORAL ) 2 % cream Apply a thin layer to affected areas on the face as needed, daily when flared and weekly as maintenance.   lisinopril -hydrochlorothiazide  (ZESTORETIC ) 10-12.5 MG tablet Take 1 tablet by mouth daily.   [DISCONTINUED] semaglutide -weight management (WEGOVY ) 1 MG/0.5ML SOAJ SQ injection Inject 1 mg into the skin once a week.   No facility-administered medications prior to visit.   "

## 2024-03-17 ENCOUNTER — Other Ambulatory Visit: Payer: Self-pay

## 2024-04-17 ENCOUNTER — Telehealth: Payer: Self-pay

## 2024-04-17 NOTE — Telephone Encounter (Signed)
 Called patient to inform her of the letter that the provider received regarding her Wegovy , requesting that in order for her insurance to cover the medication she would need to enroll and actively participate in the [employer] [facility] Program.  A program that is offered at no cost to her.  Explained that if she does not do this that if she continues on the Wegovy  she will need to pay the entire cost of the medication.  Patient verbalized understanding but did let me know that she has not took the higher dose within the last week because of how it makes her feel.  She stated that she is possibly going to take herself off the medication and go back down to the lower dose.  Reports that she has a month left of the lower dose if she decides to continue on the medication.  Also stated if she goes off it that she will just have to do it on her own and watch what she eats.  I told her I would inform the provider of her response.

## 2024-04-28 ENCOUNTER — Other Ambulatory Visit
Admission: RE | Admit: 2024-04-28 | Discharge: 2024-04-28 | Disposition: A | Source: Home / Self Care | Attending: Family Medicine | Admitting: Family Medicine

## 2024-04-28 LAB — VITAMIN D 25 HYDROXY (VIT D DEFICIENCY, FRACTURES): Vit D, 25-Hydroxy: 25.1 ng/mL — ABNORMAL LOW (ref 30–100)

## 2024-04-29 ENCOUNTER — Ambulatory Visit: Admitting: Family Medicine

## 2024-04-29 ENCOUNTER — Encounter: Payer: Self-pay | Admitting: Family Medicine

## 2024-04-29 ENCOUNTER — Other Ambulatory Visit: Payer: Self-pay

## 2024-04-29 VITALS — BP 134/63 | HR 85 | Temp 98.5°F | Ht 67.0 in | Wt 221.2 lb

## 2024-04-29 DIAGNOSIS — N951 Menopausal and female climacteric states: Secondary | ICD-10-CM

## 2024-04-29 DIAGNOSIS — N182 Chronic kidney disease, stage 2 (mild): Secondary | ICD-10-CM

## 2024-04-29 DIAGNOSIS — R7989 Other specified abnormal findings of blood chemistry: Secondary | ICD-10-CM

## 2024-04-29 DIAGNOSIS — I10 Essential (primary) hypertension: Secondary | ICD-10-CM

## 2024-04-29 DIAGNOSIS — N3090 Cystitis, unspecified without hematuria: Secondary | ICD-10-CM

## 2024-04-29 DIAGNOSIS — E559 Vitamin D deficiency, unspecified: Secondary | ICD-10-CM

## 2024-04-29 DIAGNOSIS — R7309 Other abnormal glucose: Secondary | ICD-10-CM

## 2024-04-29 DIAGNOSIS — R350 Frequency of micturition: Secondary | ICD-10-CM

## 2024-04-29 DIAGNOSIS — R252 Cramp and spasm: Secondary | ICD-10-CM

## 2024-04-29 DIAGNOSIS — E782 Mixed hyperlipidemia: Secondary | ICD-10-CM

## 2024-04-29 DIAGNOSIS — G720 Drug-induced myopathy: Secondary | ICD-10-CM

## 2024-04-29 LAB — POCT URINALYSIS DIPSTICK
Bilirubin, UA: NEGATIVE
Blood, UA: NEGATIVE
Glucose, UA: NEGATIVE
Ketones, UA: NEGATIVE
Nitrite, UA: NEGATIVE
Protein, UA: NEGATIVE
Spec Grav, UA: 1.01
Urobilinogen, UA: 0.2 U/dL
pH, UA: 6

## 2024-04-29 LAB — POCT GLYCOSYLATED HEMOGLOBIN (HGB A1C): Hemoglobin A1C: 5.8 % — AB (ref 4.0–5.6)

## 2024-04-29 MED ORDER — NITROFURANTOIN MONOHYD MACRO 100 MG PO CAPS
100.0000 mg | ORAL_CAPSULE | Freq: Two times a day (BID) | ORAL | 0 refills | Status: AC
  Filled 2024-04-29: qty 10, 5d supply, fill #0

## 2024-04-29 MED ORDER — ESTRADIOL 0.05 MG/24HR TD PTTW
1.0000 | MEDICATED_PATCH | TRANSDERMAL | 3 refills | Status: AC
  Filled 2024-04-29: qty 8, 28d supply, fill #0

## 2024-04-29 NOTE — Progress Notes (Unsigned)
 "     Established patient visit   Patient: Grace [name]   DOB: 14-Dec-1960   64 y.o. Female  MRN: [id number] Visit Date: 04/29/2024  Today's healthcare provider: LAURAINE LOISE BUOY, DO   Chief Complaint  Patient presents with   Hypertension    Patient is here for a follow up on hypertension.     Medical Management of Chronic Issues    Things to discuss:  Vitamin D3 over the counter, B-Complex and Vitamin C in the morning  Dottie Patch 1 mg (estrogen) for her hot flashes  Also reports she has been urinating frequently more than usual.  Have been having really bad cramps so bad that the muscle is sore once it is no longer cramping.   Subjective    HPI  Lisinopril  5 added to lisinopril -hydrochlorothiazide  10-12.5    The 10-year ASCVD risk score (Arnett DK, et al., 2019) is: 10.9%   Values used to calculate the score:     Age: 37 years     Clinically relevant sex: Female     Is Non-Hispanic African American: Yes     Diabetic: No     Tobacco smoker: No     Systolic Blood Pressure: 134 mmHg     Is BP treated: Yes     HDL Cholesterol: 44 mg/dL     Total Cholesterol: 231 mg/dL  ***  {History ([place]):76221}  Medications: Show/hide medication list[1]  Review of Systems ***  {Insert previous labs (optional):23779} {See past labs  Heme  Chem  Endocrine  Serology  Results Review (optional):1}   Objective    BP 134/63 (BP Location: Left Arm, Patient Position: Sitting, Cuff Size: Large)   Pulse 85   Temp 98.5 F (36.9 C) (Oral)   Ht 5' 7 (1.702 m)   Wt 221 lb 3.2 oz (100.3 kg)   SpO2 98%   BMI 34.64 kg/m  {Insert last BP/Wt (optional):23777}{See vitals history (optional):1}   Physical Exam   No results found for any visits on 04/29/24.  Assessment & Plan    Primary hypertension  Chronic kidney disease, stage 2, mildly decreased GFR  Vitamin D  deficiency  Morbid obesity (HCC)  Mixed hyperlipidemia  High serum low-density lipoprotein  (LDL)  Elevated hemoglobin J8r  Urinary frequency      A1c 5.8 ***  No follow-ups on file.      I discussed the assessment and treatment plan with the patient  The patient was provided an opportunity to ask questions and all were answered. The patient agreed with the plan and demonstrated an understanding of the instructions.   The patient was advised to call back or seek an in-person evaluation if the symptoms worsen or if the condition fails to improve as anticipated.    LAURAINE LOISE BUOY, DO  [place] [facility] [phone] (phone) [phone] (fax)  [facility]    [1]  Outpatient Medications Prior to Visit  Medication Sig Note   hydrocortisone  2.5 % ointment Apply to affected areas on the face twice daily until clear or up to one weeks time, then use as needed for flares. Not for long term use.    ketoconazole  (NIZORAL ) 2 % cream Apply a thin layer to affected areas on the face as needed, daily when flared and weekly as maintenance.    lisinopril  (ZESTRIL ) 5 MG tablet Take 1 tablet (5 mg total) by mouth daily. (To be taken with lisinopril -hydrochlorothiazide  10-12.5 mg daily (total 15-12.5 mg daily)).  lisinopril -hydrochlorothiazide  (ZESTORETIC ) 10-12.5 MG tablet Take 1 tablet by mouth daily.    [DISCONTINUED] amoxicillin -clavulanate (AUGMENTIN ) 875-125 MG tablet Take 1 tablet by mouth 2 (two) times daily.    [DISCONTINUED] semaglutide -weight management (WEGOVY ) 1.7 MG/0.75ML SOAJ SQ injection Inject 1.7 mg into the skin once a week. 04/29/2024: Stopped taking it because the higher dose made her heart race.   [DISCONTINUED] semaglutide -weight management (WEGOVY ) 2.4 MG/0.75ML SOAJ SQ injection Inject 2.4 mg into the skin once a week. 04/29/2024: Stopped taking it because the higher dose made her heart race.   No facility-administered medications prior to visit.   "

## 2024-04-29 NOTE — Assessment & Plan Note (Signed)
 Statin intolerant. Coronary calcium  score of 0.

## 2024-04-30 ENCOUNTER — Other Ambulatory Visit: Payer: Self-pay

## 2024-04-30 MED ORDER — ESTRADIOL 0.05 MG/24HR TD PTTW
MEDICATED_PATCH | TRANSDERMAL | 0 refills | Status: DC
  Filled 2024-04-30: qty 8, 28d supply, fill #0

## 2024-05-01 LAB — URINE CULTURE: Organism ID, Bacteria: NO GROWTH

## 2024-05-01 LAB — SPECIMEN STATUS REPORT

## 2024-09-15 ENCOUNTER — Encounter: Admitting: Family Medicine
